# Patient Record
Sex: Female | Born: 1970 | Race: White | Hispanic: No | Marital: Married | Smoking: Never smoker
Health system: Southern US, Community
[De-identification: ages and names within clinical notes are randomized; demographics above are authoritative.]

## PROBLEM LIST (undated history)

## (undated) DIAGNOSIS — I10 Essential (primary) hypertension: Secondary | ICD-10-CM

## (undated) DIAGNOSIS — F329 Major depressive disorder, single episode, unspecified: Secondary | ICD-10-CM

## (undated) DIAGNOSIS — E785 Hyperlipidemia, unspecified: Secondary | ICD-10-CM

## (undated) DIAGNOSIS — F32A Depression, unspecified: Secondary | ICD-10-CM

## (undated) DIAGNOSIS — F419 Anxiety disorder, unspecified: Secondary | ICD-10-CM

## (undated) DIAGNOSIS — T7840XA Allergy, unspecified, initial encounter: Secondary | ICD-10-CM

## (undated) DIAGNOSIS — Z5189 Encounter for other specified aftercare: Secondary | ICD-10-CM

## (undated) HISTORY — DX: Essential (primary) hypertension: I10

## (undated) HISTORY — DX: Encounter for other specified aftercare: Z51.89

## (undated) HISTORY — PX: NO PAST SURGERIES: SHX2092

## (undated) HISTORY — DX: Allergy, unspecified, initial encounter: T78.40XA

## (undated) HISTORY — DX: Hyperlipidemia, unspecified: E78.5

## (undated) HISTORY — DX: Depression, unspecified: F32.A

## (undated) HISTORY — DX: Anxiety disorder, unspecified: F41.9

---
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
## 2012-02-13 DIAGNOSIS — I10 Essential (primary) hypertension: Secondary | ICD-10-CM | POA: Insufficient documentation

## 2012-05-11 DIAGNOSIS — E559 Vitamin D deficiency, unspecified: Secondary | ICD-10-CM | POA: Insufficient documentation

## 2018-02-20 DIAGNOSIS — N920 Excessive and frequent menstruation with regular cycle: Secondary | ICD-10-CM | POA: Insufficient documentation

## 2018-02-20 DIAGNOSIS — E669 Obesity, unspecified: Secondary | ICD-10-CM | POA: Insufficient documentation

## 2019-03-14 ENCOUNTER — Ambulatory Visit (INDEPENDENT_AMBULATORY_CARE_PROVIDER_SITE_OTHER): Payer: 59 | Admitting: Family Medicine

## 2019-03-14 ENCOUNTER — Other Ambulatory Visit: Payer: Self-pay

## 2019-03-14 ENCOUNTER — Encounter: Payer: Self-pay | Admitting: Family Medicine

## 2019-03-14 VITALS — BP 110/78 | HR 68 | Temp 97.8°F | Ht 65.5 in | Wt 245.0 lb

## 2019-03-14 DIAGNOSIS — E785 Hyperlipidemia, unspecified: Secondary | ICD-10-CM

## 2019-03-14 DIAGNOSIS — I10 Essential (primary) hypertension: Secondary | ICD-10-CM

## 2019-03-14 DIAGNOSIS — Z0000 Encounter for general adult medical examination without abnormal findings: Secondary | ICD-10-CM

## 2019-03-14 DIAGNOSIS — F339 Major depressive disorder, recurrent, unspecified: Secondary | ICD-10-CM

## 2019-03-14 MED ORDER — HYDROCHLOROTHIAZIDE 12.5 MG PO TABS: 12.5000 mg | ORAL_TABLET | Freq: Every day | ORAL | 3 refills | Status: DC

## 2019-03-14 MED ORDER — ESCITALOPRAM OXALATE 10 MG PO TABS: 10.0000 mg | ORAL_TABLET | Freq: Every day | ORAL | 3 refills | Status: DC

## 2019-03-14 NOTE — Patient Instructions (Signed)
Preventive Care 40-47 Years Old, Female °Preventive care refers to visits with your health care provider and lifestyle choices that can promote health and wellness. This includes: °· A yearly physical exam. This may also be called an annual well check. °· Regular dental visits and eye exams. °· Immunizations. °· Screening for certain conditions. °· Healthy lifestyle choices, such as eating a healthy diet, getting regular exercise, not using drugs or products that contain nicotine and tobacco, and limiting alcohol use. °What can I expect for my preventive care visit? °Physical exam °Your health care provider will check your: °· Height and weight. This may be used to calculate body mass index (BMI), which tells if you are at a healthy weight. °· Heart rate and blood pressure. °· Skin for abnormal spots. °Counseling °Your health care provider may ask you questions about your: °· Alcohol, tobacco, and drug use. °· Emotional well-being. °· Home and relationship well-being. °· Sexual activity. °· Eating habits. °· Work and work environment. °· Method of birth control. °· Menstrual cycle. °· Pregnancy history. °What immunizations do I need? ° °Influenza (flu) vaccine °· This is recommended every year. °Tetanus, diphtheria, and pertussis (Tdap) vaccine °· You may need a Td booster every 10 years. °Varicella (chickenpox) vaccine °· You may need this if you have not been vaccinated. °Zoster (shingles) vaccine °· You may need this after age 60. °Measles, mumps, and rubella (MMR) vaccine °· You may need at least one dose of MMR if you were born in 1957 or later. You may also need a second dose. °Pneumococcal conjugate (PCV13) vaccine °· You may need this if you have certain conditions and were not previously vaccinated. °Pneumococcal polysaccharide (PPSV23) vaccine °· You may need one or two doses if you smoke cigarettes or if you have certain conditions. °Meningococcal conjugate (MenACWY) vaccine °· You may need this if you  have certain conditions. °Hepatitis A vaccine °· You may need this if you have certain conditions or if you travel or work in places where you may be exposed to hepatitis A. °Hepatitis B vaccine °· You may need this if you have certain conditions or if you travel or work in places where you may be exposed to hepatitis B. °Haemophilus influenzae type b (Hib) vaccine °· You may need this if you have certain conditions. °Human papillomavirus (HPV) vaccine °· If recommended by your health care provider, you may need three doses over 6 months. °You may receive vaccines as individual doses or as more than one vaccine together in one shot (combination vaccines). Talk with your health care provider about the risks and benefits of combination vaccines. °What tests do I need? °Blood tests °· Lipid and cholesterol levels. These may be checked every 5 years, or more frequently if you are over 50 years old. °· Hepatitis C test. °· Hepatitis B test. °Screening °· Lung cancer screening. You may have this screening every year starting at age 55 if you have a 30-pack-year history of smoking and currently smoke or have quit within the past 15 years. °· Colorectal cancer screening. All adults should have this screening starting at age 50 and continuing until age 75. Your health care provider may recommend screening at age 45 if you are at increased risk. You will have tests every 1-10 years, depending on your results and the type of screening test. °· Diabetes screening. This is done by checking your blood sugar (glucose) after you have not eaten for a while (fasting). You may have this   done every 1-3 years.  Mammogram. This may be done every 1-2 years. Talk with your health care provider about when you should start having regular mammograms. This may depend on whether you have a family history of breast cancer.  BRCA-related cancer screening. This may be done if you have a family history of breast, ovarian, tubal, or peritoneal  cancers.  Pelvic exam and Pap test. This may be done every 3 years starting at age 35. Starting at age 17, this may be done every 5 years if you have a Pap test in combination with an HPV test. Other tests  Sexually transmitted disease (STD) testing.  Bone density scan. This is done to screen for osteoporosis. You may have this scan if you are at high risk for osteoporosis. Follow these instructions at home: Eating and drinking  Eat a diet that includes fresh fruits and vegetables, whole grains, lean protein, and low-fat dairy.  Take vitamin and mineral supplements as recommended by your health care provider.  Do not drink alcohol if: ? Your health care provider tells you not to drink. ? You are pregnant, may be pregnant, or are planning to become pregnant.  If you drink alcohol: ? Limit how much you have to 0-1 drink a day. ? Be aware of how much alcohol is in your drink. In the [place] one drink equals one 12 oz bottle of beer (355 mL), one 5 oz glass of wine (148 mL), or one 1 oz glass of hard liquor (44 mL). Lifestyle  Take daily care of your teeth and gums.  Stay active. Exercise for at least 30 minutes on 5 or more days each week.  Do not use any products that contain nicotine or tobacco, such as cigarettes, e-cigarettes, and chewing tobacco. If you need help quitting, ask your health care provider.  If you are sexually active, practice safe sex. Use a condom or other form of birth control (contraception) in order to prevent pregnancy and STIs (sexually transmitted infections).  If told by your health care provider, take low-dose aspirin daily starting at age 42. What's next?  Visit your health care provider once a year for a well check visit.  Ask your health care provider how often you should have your eyes and teeth checked.  Stay up to date on all vaccines. This information is not intended to replace advice given to you by your health care provider. Make sure you  discuss any questions you have with your health care provider. Document Released: 04/24/2015 Document Revised: 12/07/2017 Document Reviewed: 12/07/2017 Elsevier Patient Education  2020 [place] With Depression Everyone experiences occasional disappointment, sadness, and loss in their lives. When you are feeling down, blue, or sad for at least 2 weeks in a row, it may mean that you have depression. Depression can affect your thoughts and feelings, relationships, daily activities, and physical health. It is caused by changes in the way your brain functions. If you receive a diagnosis of depression, your health care provider will tell you which type of depression you have and what treatment options are available to you. If you are living with depression, there are ways to help you recover from it and also ways to prevent it from coming back. How to cope with lifestyle changes Coping with stress     Stress is your bodys reaction to life changes and events, both good and bad. Stressful situations may include:  Getting married.  The death of a spouse.  Losing  a job.  Retiring.  Having a baby. Stress can last just a few hours or it can be ongoing. Stress can play a major role in depression, so it is important to learn both how to cope with stress and how to think about it differently. Talk with your health care provider or a counselor if you would like to learn more about stress reduction. He or she may suggest some stress reduction techniques, such as:  Music therapy. This can include creating music or listening to music. Choose music that you enjoy and that inspires you.  Mindfulness-based meditation. This kind of meditation can be done while sitting or walking. It involves being aware of your normal breaths, rather than trying to control your breathing.  Centering prayer. This is a kind of meditation that involves focusing on a spiritual word or phrase. Choose a word, phrase,  or sacred image that is meaningful to you and that brings you peace.  Deep breathing. To do this, expand your stomach and inhale slowly through your nose. Hold your breath for 3-5 seconds, then exhale slowly, allowing your stomach muscles to relax.  Muscle relaxation. This involves intentionally tensing muscles then relaxing them. Choose a stress reduction technique that fits your lifestyle and personality. Stress reduction techniques take time and practice to develop. Set aside 5-15 minutes a day to do them. Therapists can offer training in these techniques. The training may be covered by some insurance plans. Other things you can do to manage stress include:  Keeping a stress diary. This can help you learn what triggers your stress and ways to control your response.  Understanding what your limits are and saying no to requests or events that lead to a schedule that is too full.  Thinking about how you respond to certain situations. You may not be able to control everything, but you can control how you react.  Adding humor to your life by watching funny films or TV shows.  Making time for activities that help you relax and not feeling guilty about spending your time this way.  Medicines Your health care provider may suggest certain medicines if he or she feels that they will help improve your condition. Avoid using alcohol and other substances that may prevent your medicines from working properly (may interact). It is also important to:  Talk with your pharmacist or health care provider about all the medicines that you take, their possible side effects, and what medicines are safe to take together.  Make it your goal to take part in all treatment decisions (shared decision-making). This includes giving input on the side effects of medicines. It is best if shared decision-making with your health care provider is part of your total treatment plan. If your health care provider prescribes a  medicine, you may not notice the full benefits of it for 4-8 weeks. Most people who are treated for depression need to be on medicine for at least 6-12 months after they feel better. If you are taking medicines as part of your treatment, do not stop taking medicines without first talking to your health care provider. You may need to have the medicine slowly decreased (tapered) over time to decrease the risk of harmful side effects. Relationships Your health care provider may suggest family therapy along with individual therapy and drug therapy. While there may not be family problems that are causing you to feel depressed, it is still important to make sure your family learns as much as they can about  your mental health. Having your familys support can help make your treatment successful. How to recognize changes in your condition Everyone has a different response to treatment for depression. Recovery from major depression happens when you have not had signs of major depression for two months. This may mean that you will start to:  Have more interest in doing activities.  Feel less hopeless than you did 2 months ago.  Have more energy.  Overeat less often, or have better or improving appetite.  Have better concentration. Your health care provider will work with you to decide the next steps in your recovery. It is also important to recognize when your condition is getting worse. Watch for these signs:  Having fatigue or low energy.  Eating too much or too little.  Sleeping too much or too little.  Feeling restless, agitated, or hopeless.  Having trouble concentrating or making decisions.  Having unexplained physical complaints.  Feeling irritable, angry, or aggressive. Get help as soon as you or your family members notice these symptoms coming back. How to get support and help from others How to talk with friends and family members about your condition  Talking to friends and family  members about your condition can provide you with one way to get support and guidance. Reach out to trusted friends or family members, explain your symptoms to them, and let them know that you are working with a health care provider to treat your depression. Financial resources Not all insurance plans cover mental health care, so it is important to check with your insurance carrier. If paying for co-pays or counseling services is a problem, search for a local or county mental health care center. They may be able to offer public mental health care services at low or no cost when you are not able to see a private health care provider. If you are taking medicine for depression, you may be able to get the generic form, which may be less expensive. Some makers of prescription medicines also offer help to patients who cannot afford the medicines they need. Follow these instructions at home:   Get the right amount and quality of sleep.  Cut down on using caffeine, tobacco, alcohol, and other potentially harmful substances.  Try to exercise, such as walking or lifting small weights.  Take over-the-counter and prescription medicines only as told by your health care provider.  Eat a healthy diet that includes plenty of vegetables, fruits, whole grains, low-fat dairy products, and lean protein. Do not eat a lot of foods that are high in solid fats, added sugars, or salt.  Keep all follow-up visits as told by your health care provider. This is important. Contact a health care provider if:  You stop taking your antidepressant medicines, and you have any of these symptoms: ? Nausea. ? Headache. ? Feeling lightheaded. ? Chills and body aches. ? Not being able to sleep (insomnia).  You or your friends and family think your depression is getting worse. Get help right away if:  You have thoughts of hurting yourself or others. If you ever feel like you may hurt yourself or others, or have thoughts about  taking your own life, get help right away. You can go to your nearest emergency department or call:  Your local emergency services (911 in the [place]  A suicide crisis helpline, such as the [place] at (351)574-2551. This is open 24-hours a day. Summary  If you are living with depression, there are  ways to help you recover from it and also ways to prevent it from coming back.  Work with your health care team to create a management plan that includes counseling, stress management techniques, and healthy lifestyle habits. This information is not intended to replace advice given to you by your health care provider. Make sure you discuss any questions you have with your health care provider. Document Released: 02/29/2016 Document Revised: 07/20/2018 Document Reviewed: 02/29/2016 Elsevier Patient Education  2020 [employer].  Managing Your Hypertension Hypertension is commonly called high blood pressure. This is when the force of your blood pressing against the walls of your arteries is too strong. Arteries are blood vessels that carry blood from your heart throughout your body. Hypertension forces the heart to work harder to pump blood, and may cause the arteries to become narrow or stiff. Having untreated or uncontrolled hypertension can cause heart attack, stroke, kidney disease, and other problems. What are blood pressure readings? A blood pressure reading consists of a higher number over a lower number. Ideally, your blood pressure should be below 120/80. The first ("top") number is called the systolic pressure. It is a measure of the pressure in your arteries as your heart beats. The second ("bottom") number is called the diastolic pressure. It is a measure of the pressure in your arteries as the heart relaxes. What does my blood pressure reading mean? Blood pressure is classified into four stages. Based on your blood pressure reading, your health care provider may  use the following stages to determine what type of treatment you need, if any. Systolic pressure and diastolic pressure are measured in a unit called mm Hg. Normal  Systolic pressure: below 253.  Diastolic pressure: below 80. Elevated  Systolic pressure: 664-403.  Diastolic pressure: below 80. Hypertension stage 1  Systolic pressure: 474-259.  Diastolic pressure: 56-38. Hypertension stage 2  Systolic pressure: 756 or above.  Diastolic pressure: 90 or above. What health risks are associated with hypertension? Managing your hypertension is an important responsibility. Uncontrolled hypertension can lead to:  A heart attack.  A stroke.  A weakened blood vessel (aneurysm).  Heart failure.  Kidney damage.  Eye damage.  Metabolic syndrome.  Memory and concentration problems. What changes can I make to manage my hypertension? Hypertension can be managed by making lifestyle changes and possibly by taking medicines. Your health care provider will help you make a plan to bring your blood pressure within a normal range. Eating and drinking   Eat a diet that is high in fiber and potassium, and low in salt (sodium), added sugar, and fat. An example eating plan is called the DASH (Dietary Approaches to Stop Hypertension) diet. To eat this way: ? Eat plenty of fresh fruits and vegetables. Try to fill half of your plate at each meal with fruits and vegetables. ? Eat whole grains, such as whole wheat pasta, brown rice, or whole grain bread. Fill about one quarter of your plate with whole grains. ? Eat low-fat diary products. ? Avoid fatty cuts of meat, processed or cured meats, and poultry with skin. Fill about one quarter of your plate with lean proteins such as fish, chicken without skin, beans, eggs, and tofu. ? Avoid premade and processed foods. These tend to be higher in sodium, added sugar, and fat.  Reduce your daily sodium intake. Most people with hypertension should eat less  than 1,500 mg of sodium a day.  Limit alcohol intake to no more than 1 drink a day  for nonpregnant women and 2 drinks a day for men. One drink equals 12 oz of beer, 5 oz of wine, or 1 oz of hard liquor. Lifestyle  Work with your health care provider to maintain a healthy body weight, or to lose weight. Ask what an ideal weight is for you.  Get at least 30 minutes of exercise that causes your heart to beat faster (aerobic exercise) most days of the week. Activities may include walking, swimming, or biking.  Include exercise to strengthen your muscles (resistance exercise), such as weight lifting, as part of your weekly exercise routine. Try to do these types of exercises for 30 minutes at least 3 days a week.  Do not use any products that contain nicotine or tobacco, such as cigarettes and e-cigarettes. If you need help quitting, ask your health care provider.  Control any long-term (chronic) conditions you have, such as high cholesterol or diabetes. Monitoring  Monitor your blood pressure at home as told by your health care provider. Your personal target blood pressure may vary depending on your medical conditions, your age, and other factors.  Have your blood pressure checked regularly, as often as told by your health care provider. Working with your health care provider  Review all the medicines you take with your health care provider because there may be side effects or interactions.  Talk with your health care provider about your diet, exercise habits, and other lifestyle factors that may be contributing to hypertension.  Visit your health care provider regularly. Your health care provider can help you create and adjust your plan for managing hypertension. Will I need medicine to control my blood pressure? Your health care provider may prescribe medicine if lifestyle changes are not enough to get your blood pressure under control, and if:  Your systolic blood pressure is 130 or  higher.  Your diastolic blood pressure is 80 or higher. Take medicines only as told by your health care provider. Follow the directions carefully. Blood pressure medicines must be taken as prescribed. The medicine does not work as well when you skip doses. Skipping doses also puts you at risk for problems. Contact a health care provider if:  You think you are having a reaction to medicines you have taken.  You have repeated (recurrent) headaches.  You feel dizzy.  You have swelling in your ankles.  You have trouble with your vision. Get help right away if:  You develop a severe headache or confusion.  You have unusual weakness or numbness, or you feel faint.  You have severe pain in your chest or abdomen.  You vomit repeatedly.  You have trouble breathing. Summary  Hypertension is when the force of blood pumping through your arteries is too strong. If this condition is not controlled, it may put you at risk for serious complications.  Your personal target blood pressure may vary depending on your medical conditions, your age, and other factors. For most people, a normal blood pressure is less than 120/80.  Hypertension is managed by lifestyle changes, medicines, or both. Lifestyle changes include weight loss, eating a healthy, low-sodium diet, exercising more, and limiting alcohol. This information is not intended to replace advice given to you by your health care provider. Make sure you discuss any questions you have with your health care provider. Document Released: 12/21/2011 Document Revised: 07/20/2018 Document Reviewed: 02/24/2016 Elsevier Patient Education  2020 [employer].  Exercising to Lose Weight Exercise is structured, repetitive physical activity to improve fitness and health.  Getting regular exercise is important for everyone. It is especially important if you are overweight. Being overweight increases your risk of heart disease, stroke, diabetes, high blood  pressure, and several types of cancer. Reducing your calorie intake and exercising can help you lose weight. Exercise is usually categorized as moderate or vigorous intensity. To lose weight, most people need to do a certain amount of moderate-intensity or vigorous-intensity exercise each week. Moderate-intensity exercise  Moderate-intensity exercise is any activity that gets you moving enough to burn at least three times more energy (calories) than if you were sitting. Examples of moderate exercise include:  Walking a mile in 15 minutes.  Doing light yard work.  Biking at an easy pace. Most people should get at least 150 minutes (2 hours and 30 minutes) a week of moderate-intensity exercise to maintain their body weight. Vigorous-intensity exercise Vigorous-intensity exercise is any activity that gets you moving enough to burn at least six times more calories than if you were sitting. When you exercise at this intensity, you should be working hard enough that you are not able to carry on a conversation. Examples of vigorous exercise include:  Running.  Playing a team sport, such as football, basketball, and soccer.  Jumping rope. Most people should get at least 75 minutes (1 hour and 15 minutes) a week of vigorous-intensity exercise to maintain their body weight. How can exercise affect me? When you exercise enough to burn more calories than you eat, you lose weight. Exercise also reduces body fat and builds muscle. The more muscle you have, the more calories you burn. Exercise also:  Improves mood.  Reduces stress and tension.  Improves your overall fitness, flexibility, and endurance.  Increases bone strength. The amount of exercise you need to lose weight depends on:  Your age.  The type of exercise.  Any health conditions you have.  Your overall physical ability. Talk to your health care provider about how much exercise you need and what types of activities are safe for  you. What actions can I take to lose weight? Nutrition   Make changes to your diet as told by your health care provider or diet and nutrition specialist (dietitian). This may include: ? Eating fewer calories. ? Eating more protein. ? Eating less unhealthy fats. ? Eating a diet that includes fresh fruits and vegetables, whole grains, low-fat dairy products, and lean protein. ? Avoiding foods with added fat, salt, and sugar.  Drink plenty of water while you exercise to prevent dehydration or heat stroke. Activity  Choose an activity that you enjoy and set realistic goals. Your health care provider can help you make an exercise plan that works for you.  Exercise at a moderate or vigorous intensity most days of the week. ? The intensity of exercise may vary from person to person. You can tell how intense a workout is for you by paying attention to your breathing and heartbeat. Most people will notice their breathing and heartbeat get faster with more intense exercise.  Do resistance training twice each week, such as: ? Push-ups. ? Sit-ups. ? Lifting weights. ? Using resistance bands.  Getting short amounts of exercise can be just as helpful as long structured periods of exercise. If you have trouble finding time to exercise, try to include exercise in your daily routine. ? Get up, stretch, and walk around every 30 minutes throughout the day. ? Go for a walk during your lunch break. ? Park your car farther away from your destination. ?  If you take public transportation, get off one stop early and walk the rest of the way. ? Make phone calls while standing up and walking around. ? Take the stairs instead of elevators or escalators.  Wear comfortable clothes and shoes with good support.  Do not exercise so much that you hurt yourself, feel dizzy, or get very short of breath. Where to find more information  U.S. Department of Health and Human Services: BondedCompany.at  Centers for  Disease Control and Prevention (CDC): http://www.wolf.info/ Contact a health care provider:  Before starting a new exercise program.  If you have questions or concerns about your weight.  If you have a medical problem that keeps you from exercising. Get help right away if you have any of the following while exercising:  Injury.  Dizziness.  Difficulty breathing or shortness of breath that does not go away when you stop exercising.  Chest pain.  Rapid heartbeat. Summary  Being overweight increases your risk of heart disease, stroke, diabetes, high blood pressure, and several types of cancer.  Losing weight happens when you burn more calories than you eat.  Reducing the amount of calories you eat in addition to getting regular moderate or vigorous exercise each week helps you lose weight. This information is not intended to replace advice given to you by your health care provider. Make sure you discuss any questions you have with your health care provider. Document Released: 04/30/2010 Document Revised: 04/10/2017 Document Reviewed: 04/10/2017 Elsevier Patient Education  2020 [place].  Preventing Unhealthy [employer], Adult Staying at a healthy weight is important to your overall health. When fat builds up in your body, you may become overweight or obese. Being overweight or obese increases your risk of developing certain health problems, such as heart disease, diabetes, sleeping problems, joint problems, and some types of cancer. Unhealthy weight gain is often the result of making unhealthy food choices or not getting enough exercise. You can make changes to your lifestyle to prevent obesity and stay as healthy as possible. What nutrition changes can be made?   Eat only as much as your body needs. To do this: ? Pay attention to signs that you are hungry or full. Stop eating as soon as you feel full. ? If you feel hungry, try drinking water first before eating. Drink enough water so  your urine is clear or pale yellow. ? Eat smaller portions. Pay attention to portion sizes when eating out. ? Look at serving sizes on food labels. Most foods contain more than one serving per container. ? Eat the recommended number of calories for your gender and activity level. For most active people, a daily total of 2,000 calories is appropriate. If you are trying to lose weight or are not very active, you may need to eat fewer calories. Talk with your health care provider or a diet and nutrition specialist (dietitian) about how many calories you need each day.  Choose healthy foods, such as: ? Fruits and vegetables. At each meal, try to fill at least half of your plate with fruits and vegetables. ? Whole grains, such as whole-wheat bread, brown rice, and quinoa. ? Lean meats, such as chicken or fish. ? Other healthy proteins, such as beans, eggs, or tofu. ? Healthy fats, such as nuts, seeds, fatty fish, and olive oil. ? Low-fat or fat-free dairy products.  Check food labels, and avoid food and drinks that: ? Are high in calories. ? Have added sugar. ? Are high in  sodium. ? Have saturated fats or trans fats.  Cook foods in healthier ways, such as by baking, broiling, or grilling.  Make a meal plan for the week, and shop with a grocery list to help you stay on track with your purchases. Try to avoid going to the grocery store when you are hungry.  When grocery shopping, try to shop around the outside of the store first, where the fresh foods are. Doing this helps you to avoid prepackaged foods, which can be high in sugar, salt (sodium), and fat. What lifestyle changes can be made?   Exercise for 30 or more minutes on 5 or more days each week. Exercising may include brisk walking, yard work, biking, running, swimming, and team sports like basketball and soccer. Ask your health care provider which exercises are safe for you.  Do muscle-strengthening activities, such as lifting weights  or using resistance bands, on 2 or more days a week.  Do not use any products that contain nicotine or tobacco, such as cigarettes and e-cigarettes. If you need help quitting, ask your health care provider.  Limit alcohol intake to no more than 1 drink a day for nonpregnant women and 2 drinks a day for men. One drink equals 12 oz of beer, 5 oz of wine, or 1 oz of hard liquor.  Try to get 7-9 hours of sleep each night. What other changes can be made?  Keep a food and activity journal to keep track of: ? What you ate and how many calories you had. Remember to count the calories in sauces, dressings, and side dishes. ? Whether you were active, and what exercises you did. ? Your calorie, weight, and activity goals.  Check your weight regularly. Track any changes. If you notice you have gained weight, make changes to your diet or activity routine.  Avoid taking weight-loss medicines or supplements. Talk to your health care provider before starting any new medicine or supplement.  Talk to your health care provider before trying any new diet or exercise plan. Why are these changes important? Eating healthy, staying active, and having healthy habits can help you to prevent obesity. Those changes also:  Help you manage stress and emotions.  Help you connect with friends and family.  Improve your self-esteem.  Improve your sleep.  Prevent long-term health problems. What can happen if changes are not made? Being obese or overweight can cause you to develop joint or bone problems, which can make it hard for you to stay active or do activities you enjoy. Being obese or overweight also puts stress on your heart and lungs and can lead to health problems like diabetes, heart disease, and some cancers. Where to find more information Talk with your health care provider or a dietitian about healthy eating and healthy lifestyle choices. You may also find information from:  [place]. Department of  Agriculture, My[employer]: FormerBoss.no  American Heart Association: www.heart.org  Centers for Disease Control and Prevention: http://www.wolf.info/ Summary  Staying at a healthy weight is important to your overall health. It helps you to prevent certain diseases and health problems, such as heart disease, diabetes, joint problems, sleep disorders, and some types of cancer.  Being obese or overweight can cause you to develop joint or bone problems, which can make it hard for you to stay active or do activities you enjoy.  You can prevent unhealthy weight gain by eating a healthy diet, exercising regularly, not smoking, limiting alcohol, and getting enough sleep.  Talk with  your health care provider or a dietitian for guidance about healthy eating and healthy lifestyle choices. This information is not intended to replace advice given to you by your health care provider. Make sure you discuss any questions you have with your health care provider. Document Released: 03/29/2016 Document Revised: 03/31/2017 Document Reviewed: 05/04/2016 Elsevier Patient Education  2020 [employer].

## 2019-03-14 NOTE — Progress Notes (Signed)
Patient presents to clinic today to f/u on chronic conditions, CPE, and establish care.  SUBJECTIVE: PMH: Pt is a 48 yo female with pmh sig for h/o depression, HTN, HLD.    HTN:  -taking HCTZ 12.5 mg daily -needs refill -planning to exercise  HLD: -not currently on meds  H/o Depression: -taking Escitalopram 10 mg daily  -requesting refill -states mood, sleep, and energy are good on medication. -denies SI/HI  Allergies: NKDA -Notes her Dad and sister are allergic to IV dye.  Social hx: Pt and her partner are married.  Pt works in Insurance claims handler.  Pt endorses social EtOH use.  Pt denies tobacco and drug use.  Health Maintenance: Dental --Dr. Christie [facility] OB/Gyn:  Dr. Si Raider Immunizations -- Tetanus 2012?, influenza 2020 LMP--03/03/19 Mammogram -- July 2020 PAP --  July 2020  Family hx: Mom- alive, HTN, HLD Dad- desc, Pancreatic cancer, Depression, MI, HLD, HTN Sister- Susu, alive, Asthma, depression MGM-desc, HTN, HLd MGF, desc, MI, heart dz, HLd, HTN PGM-breast cancer PGF-desc, mesothelioma with mets to the brain.  Past Medical History:  Diagnosis Date  . Depression   . Hyperlipidemia   . Hypertension     History reviewed. No pertinent surgical history.  No current outpatient medications on file prior to visit.   No current facility-administered medications on file prior to visit.     No Known Allergies  Family History  Problem Relation Age of Onset  . Hypertension Mother   . Hyperlipidemia Mother   . Hyperlipidemia Father   . Hypertension Father   . Heart attack Father   . Depression Father   . Cancer Father   . Asthma Sister   . Depression Sister   . Hypertension Maternal Grandmother   . Hyperlipidemia Maternal Grandmother   . Hypertension Maternal Grandfather   . Hyperlipidemia Maternal Grandfather   . Heart disease Maternal Grandfather   . Heart attack Maternal Grandfather     Social History   Socioeconomic History  . Marital  status: Unknown    Spouse name: Not on file  . Number of children: Not on file  . Years of education: Not on file  . Highest education level: Not on file  Occupational History  . Not on file  Social Needs  . Financial resource strain: Not on file  . Food insecurity    Worry: Not on file    Inability: Not on file  . Transportation needs    Medical: Not on file    Non-medical: Not on file  Tobacco Use  . Smoking status: Never Smoker  . Smokeless tobacco: Never Used  Substance and Sexual Activity  . Alcohol use: Yes  . Drug use: Never  . Sexual activity: Yes  Lifestyle  . Physical activity    Days per week: Not on file    Minutes per session: Not on file  . Stress: Not on file  Relationships  . Social Herbalist on phone: Not on file    Gets together: Not on file    Attends religious service: Not on file    Active member of club or organization: Not on file    Attends meetings of clubs or organizations: Not on file    Relationship status: Not on file  . Intimate partner violence    Fear of current or ex partner: Not on file    Emotionally abused: Not on file    Physically abused: Not on file    Forced  sexual activity: Not on file  Other Topics Concern  . Not on file  Social History Narrative  . Not on file    ROS General: Denies fever, chills, night sweats, changes in weight, changes in appetite HEENT: Denies headaches, ear pain, changes in vision, rhinorrhea, sore throat CV: Denies CP, palpitations, SOB, orthopnea Pulm: Denies SOB, cough, wheezing GI: Denies abdominal pain, nausea, vomiting, diarrhea, constipation GU: Denies dysuria, hematuria, frequency, vaginal discharge Msk: Denies muscle cramps, joint pains Neuro: Denies weakness, numbness, tingling Skin: Denies rashes, bruising Psych: Denies anxiety, hallucinations  +h/o depression  BP 110/78 (BP Location: Left Arm, Patient Position: Sitting, Cuff Size: Large)   Pulse 68   Temp 97.8 F (36.6  C) (Temporal)   Ht 5' 5.5" (1.664 m)   Wt 245 lb (111.1 kg)   LMP 03/03/2019 (Exact Date)   SpO2 98%   BMI 40.15 kg/m   Physical Exam Gen. Pleasant, well developed, well-nourished, in NAD HEENT - [place]/AT, PERRL, EOMI, conjunctive clear, no scleral icterus, no nasal drainage, pharynx without erythema or exudate. Neck: No JVD, no thyromegaly, no carotid bruits Lungs: no use of accessory muscles, CTAB, no wheezes, rales or rhonchi Cardiovascular: RRR, No r/g/m, no peripheral edema Abdomen: BS present, soft, nontender,nondistended, no hepatosplenomegaly Musculoskeletal: No deformities, moves all four extremities, no cyanosis or clubbing, normal tone Neuro:  A&Ox3, CN II-XII intact, normal gait Skin:  Warm, dry, intact, no lesions Psych: normal affect, mood appropriate   No results found for this or any previous visit (from the past 2160 hour(s)).  Assessment/Plan: Well adult exam  -Anticipatory guidance given including wearing seatbelts, smoke detectors in the home, increasing physical activity, increasing p.o. intake of water and vegetables. -will obtain labs -Pap and mammogram up to date done July 2020 -immunizations up to date -given handout -next CPE in 1 yr. - Plan: CBC (no diff)  Essential hypertension  -controlled -discussed lifestyle modifications including increasing physical activity - Plan: Basic Metabolic Panel, hydrochlorothiazide (HYDRODIURIL) 12.5 MG tablet  Depression, recurrent (HCC)  -stable -continue lexapro 10 mg daily -consider counseling. - Plan: TSH, T4, Free, escitalopram (LEXAPRO) 10 MG tablet  Hyperlipidemia, unspecified hyperlipidemia type  - Plan: Lipid panel  F/u prn in 3 months  Grier Mitts, MD

## 2019-03-15 ENCOUNTER — Other Ambulatory Visit (INDEPENDENT_AMBULATORY_CARE_PROVIDER_SITE_OTHER): Payer: 59

## 2019-03-15 DIAGNOSIS — F339 Major depressive disorder, recurrent, unspecified: Secondary | ICD-10-CM | POA: Diagnosis not present

## 2019-03-15 DIAGNOSIS — I10 Essential (primary) hypertension: Secondary | ICD-10-CM

## 2019-03-15 DIAGNOSIS — Z13220 Encounter for screening for lipoid disorders: Secondary | ICD-10-CM

## 2019-03-15 DIAGNOSIS — Z0000 Encounter for general adult medical examination without abnormal findings: Secondary | ICD-10-CM

## 2019-03-15 LAB — CBC
HCT: 41.8 % (ref 36.0–46.0)
Hemoglobin: 14.1 g/dL (ref 12.0–15.0)
MCHC: 33.8 g/dL (ref 30.0–36.0)
MCV: 89.5 fl (ref 78.0–100.0)
Platelets: 278 10*3/uL (ref 150.0–400.0)
RBC: 4.67 Mil/uL (ref 3.87–5.11)
RDW: 12.7 % (ref 11.5–15.5)
WBC: 6.2 10*3/uL (ref 4.0–10.5)

## 2019-03-15 LAB — LIPID PANEL
Cholesterol: 268 mg/dL — ABNORMAL HIGH (ref 0–200)
HDL: 54.2 mg/dL (ref >39.00)
NonHDL: 213.45
Total CHOL/HDL Ratio: 5
Triglycerides: 398 mg/dL — ABNORMAL HIGH (ref 0.0–149.0)
VLDL: 79.6 mg/dL — ABNORMAL HIGH (ref 0.0–40.0)

## 2019-03-15 LAB — BASIC METABOLIC PANEL
BUN: 15 mg/dL (ref 6–23)
CO2: 26 mEq/L (ref 19–32)
Calcium: 9.2 mg/dL (ref 8.4–10.5)
Chloride: 102 mEq/L (ref 96–112)
Creatinine, Ser: 0.73 mg/dL (ref 0.40–1.20)
GFR: 85.39 mL/min (ref >60.00)
Glucose, Bld: 101 mg/dL — ABNORMAL HIGH (ref 70–99)
Potassium: 4.1 mEq/L (ref 3.5–5.1)
Sodium: 139 mEq/L (ref 135–145)

## 2019-03-15 LAB — T4, FREE: Free T4: 0.7 ng/dL (ref 0.60–1.60)

## 2019-03-15 LAB — LDL CHOLESTEROL, DIRECT: Direct LDL: 140 mg/dL

## 2019-03-15 LAB — TSH: TSH: 2.88 u[IU]/mL (ref 0.35–4.50)

## 2019-03-17 ENCOUNTER — Encounter: Payer: Self-pay | Admitting: Family Medicine

## 2019-03-17 DIAGNOSIS — E785 Hyperlipidemia, unspecified: Secondary | ICD-10-CM | POA: Insufficient documentation

## 2019-03-17 DIAGNOSIS — F32A Depression, unspecified: Secondary | ICD-10-CM | POA: Insufficient documentation

## 2019-03-17 DIAGNOSIS — I10 Essential (primary) hypertension: Secondary | ICD-10-CM | POA: Insufficient documentation

## 2019-03-17 DIAGNOSIS — F339 Major depressive disorder, recurrent, unspecified: Secondary | ICD-10-CM | POA: Insufficient documentation

## 2020-03-06 ENCOUNTER — Other Ambulatory Visit: Payer: Self-pay | Admitting: Family Medicine

## 2020-03-06 DIAGNOSIS — I10 Essential (primary) hypertension: Secondary | ICD-10-CM

## 2020-03-06 DIAGNOSIS — F339 Major depressive disorder, recurrent, unspecified: Secondary | ICD-10-CM

## 2020-03-16 ENCOUNTER — Other Ambulatory Visit: Payer: Self-pay

## 2020-03-16 ENCOUNTER — Ambulatory Visit (INDEPENDENT_AMBULATORY_CARE_PROVIDER_SITE_OTHER): Payer: BC Managed Care – PPO | Admitting: Family Medicine

## 2020-03-16 ENCOUNTER — Encounter: Payer: Self-pay | Admitting: Family Medicine

## 2020-03-16 VITALS — BP 124/82 | HR 76 | Temp 98.6°F | Ht 66.0 in | Wt 249.0 lb

## 2020-03-16 DIAGNOSIS — J018 Other acute sinusitis: Secondary | ICD-10-CM | POA: Diagnosis not present

## 2020-03-16 DIAGNOSIS — Z23 Encounter for immunization: Secondary | ICD-10-CM

## 2020-03-16 DIAGNOSIS — Z0001 Encounter for general adult medical examination with abnormal findings: Secondary | ICD-10-CM

## 2020-03-16 DIAGNOSIS — Z6841 Body Mass Index (BMI) 40.0 and over, adult: Secondary | ICD-10-CM

## 2020-03-16 DIAGNOSIS — E782 Mixed hyperlipidemia: Secondary | ICD-10-CM | POA: Diagnosis not present

## 2020-03-16 DIAGNOSIS — Z0000 Encounter for general adult medical examination without abnormal findings: Secondary | ICD-10-CM

## 2020-03-16 DIAGNOSIS — I10 Essential (primary) hypertension: Secondary | ICD-10-CM | POA: Diagnosis not present

## 2020-03-16 DIAGNOSIS — F339 Major depressive disorder, recurrent, unspecified: Secondary | ICD-10-CM

## 2020-03-16 MED ORDER — AMOXICILLIN-POT CLAVULANATE 875-125 MG PO TABS: 1.0000 | ORAL_TABLET | Freq: Two times a day (BID) | ORAL | 0 refills | Status: AC

## 2020-03-16 MED ORDER — ESCITALOPRAM OXALATE 10 MG PO TABS: 10.0000 mg | ORAL_TABLET | Freq: Every day | ORAL | 3 refills | Status: DC

## 2020-03-16 MED ORDER — HYDROCHLOROTHIAZIDE 12.5 MG PO TABS: 12.5000 mg | ORAL_TABLET | Freq: Every day | ORAL | 3 refills | Status: DC

## 2020-03-16 NOTE — Patient Instructions (Signed)
Preventive Care 40-48 Years Old, Female Preventive care refers to visits with your health care provider and lifestyle choices that can promote health and wellness. This includes:  A yearly physical exam. This may also be called an annual well check.  Regular dental visits and eye exams.  Immunizations.  Screening for certain conditions.  Healthy lifestyle choices, such as eating a healthy diet, getting regular exercise, not using drugs or products that contain nicotine and tobacco, and limiting alcohol use. What can I expect for my preventive care visit? Physical exam Your health care provider will check your:  Height and weight. This may be used to calculate body mass index (BMI), which tells if you are at a healthy weight.  Heart rate and blood pressure.  Skin for abnormal spots. Counseling Your health care provider may ask you questions about your:  Alcohol, tobacco, and drug use.  Emotional well-being.  Home and relationship well-being.  Sexual activity.  Eating habits.  Work and work environment.  Method of birth control.  Menstrual cycle.  Pregnancy history. What immunizations do I need?  Influenza (flu) vaccine  This is recommended every year. Tetanus, diphtheria, and pertussis (Tdap) vaccine  You may need a Td booster every 10 years. Varicella (chickenpox) vaccine  You may need this if you have not been vaccinated. Zoster (shingles) vaccine  You may need this after age 60. Measles, mumps, and rubella (MMR) vaccine  You may need at least one dose of MMR if you were born in 1957 or later. You may also need a second dose. Pneumococcal conjugate (PCV13) vaccine  You may need this if you have certain conditions and were not previously vaccinated. Pneumococcal polysaccharide (PPSV23) vaccine  You may need one or two doses if you smoke cigarettes or if you have certain conditions. Meningococcal conjugate (MenACWY) vaccine  You may need this if you  have certain conditions. Hepatitis A vaccine  You may need this if you have certain conditions or if you travel or work in places where you may be exposed to hepatitis A. Hepatitis B vaccine  You may need this if you have certain conditions or if you travel or work in places where you may be exposed to hepatitis B. Haemophilus influenzae type b (Hib) vaccine  You may need this if you have certain conditions. Human papillomavirus (HPV) vaccine  If recommended by your health care provider, you may need three doses over 6 months. You may receive vaccines as individual doses or as more than one vaccine together in one shot (combination vaccines). Talk with your health care provider about the risks and benefits of combination vaccines. What tests do I need? Blood tests  Lipid and cholesterol levels. These may be checked every 5 years, or more frequently if you are over 48 years old.  Hepatitis C test.  Hepatitis B test. Screening  Lung cancer screening. You may have this screening every year starting at age 48 if you have a 30-pack-year history of smoking and currently smoke or have quit within the past 15 years.  Colorectal cancer screening. All adults should have this screening starting at age 48 and continuing until age 48. Your health care provider may recommend screening at age 48 if you are at increased risk. You will have tests every 1-10 years, depending on your results and the type of screening test.  Diabetes screening. This is done by checking your blood sugar (glucose) after you have not eaten for a while (fasting). You may have this   done every 1-3 years.  Mammogram. This may be done every 1-2 years. Talk with your health care provider about when you should start having regular mammograms. This may depend on whether you have a family history of breast cancer.  BRCA-related cancer screening. This may be done if you have a family history of breast, ovarian, tubal, or peritoneal  cancers.  Pelvic exam and Pap test. This may be done every 3 years starting at age 48. Starting at age 48, this may be done every 5 years if you have a Pap test in combination with an HPV test. Other tests  Sexually transmitted disease (STD) testing.  Bone density scan. This is done to screen for osteoporosis. You may have this scan if you are at high risk for osteoporosis. Follow these instructions at home: Eating and drinking  Eat a diet that includes fresh fruits and vegetables, whole grains, lean protein, and low-fat dairy.  Take vitamin and mineral supplements as recommended by your health care provider.  Do not drink alcohol if: ? Your health care provider tells you not to drink. ? You are pregnant, may be pregnant, or are planning to become pregnant.  If you drink alcohol: ? Limit how much you have to 0-1 drink a day. ? Be aware of how much alcohol is in your drink. In the [place] one drink equals one 12 oz bottle of beer (355 mL), one 5 oz glass of wine (148 mL), or one 1 oz glass of hard liquor (44 mL). Lifestyle  Take daily care of your teeth and gums.  Stay active. Exercise for at least 30 minutes on 5 or more days each week.  Do not use any products that contain nicotine or tobacco, such as cigarettes, e-cigarettes, and chewing tobacco. If you need help quitting, ask your health care provider.  If you are sexually active, practice safe sex. Use a condom or other form of birth control (contraception) in order to prevent pregnancy and STIs (sexually transmitted infections).  If told by your health care provider, take low-dose aspirin daily starting at age 48. What's next?  Visit your health care provider once a year for a well check visit.  Ask your health care provider how often you should have your eyes and teeth checked.  Stay up to date on all vaccines. This information is not intended to replace advice given to you by your health care provider. Make sure you  discuss any questions you have with your health care provider. Document Revised: 12/07/2017 Document Reviewed: 12/07/2017 Elsevier Patient Education  2020 [employer].  High Cholesterol  High cholesterol is a condition in which the blood has high levels of a white, waxy, fat-like substance (cholesterol). The human body needs small amounts of cholesterol. The liver makes all the cholesterol that the body needs. Extra (excess) cholesterol comes from the food that we eat. Cholesterol is carried from the liver by the blood through the blood vessels. If you have high cholesterol, deposits (plaques) may build up on the walls of your blood vessels (arteries). Plaques make the arteries narrower and stiffer. Cholesterol plaques increase your risk for heart attack and stroke. Work with your health care provider to keep your cholesterol levels in a healthy range. What increases the risk? This condition is more likely to develop in people who:  Eat foods that are high in animal fat (saturated fat) or cholesterol.  Are overweight.  Are not getting enough exercise.  Have a family history of high cholesterol.  What are the signs or symptoms? There are no symptoms of this condition. How is this diagnosed? This condition may be diagnosed from the results of a blood test.  If you are older than age 77, your health care provider may check your cholesterol every 4-6 years.  You may be checked more often if you already have high cholesterol or other risk factors for heart disease. The blood test for cholesterol measures:  "Bad" cholesterol (LDL cholesterol). This is the main type of cholesterol that causes heart disease. The desired level for LDL is less than 100.  "Good" cholesterol (HDL cholesterol). This type helps to protect against heart disease by cleaning the arteries and carrying the LDL away. The desired level for HDL is 60 or higher.  Triglycerides. These are fats that the body can store or  burn for energy. The desired number for triglycerides is lower than 150.  Total cholesterol. This is a measure of the total amount of cholesterol in your blood, including LDL cholesterol, HDL cholesterol, and triglycerides. A healthy number is less than 200. How is this treated? This condition is treated with diet changes, lifestyle changes, and medicines. Diet changes  This may include eating more whole grains, fruits, vegetables, nuts, and fish.  This may also include cutting back on red meat and foods that have a lot of added sugar. Lifestyle changes  Changes may include getting at least 40 minutes of aerobic exercise 3 times a week. Aerobic exercises include walking, biking, and swimming. Aerobic exercise along with a healthy diet can help you maintain a healthy weight.  Changes may also include quitting smoking. Medicines  Medicines are usually given if diet and lifestyle changes have failed to reduce your cholesterol to healthy levels.  Your health care provider may prescribe a statin medicine. Statin medicines have been shown to reduce cholesterol, which can reduce the risk of heart disease. Follow these instructions at home: Eating and drinking If told by your health care provider:  Eat chicken (without skin), fish, veal, shellfish, ground [place] breast, and round or loin cuts of red meat.  Do not eat fried foods or fatty meats, such as hot dogs and salami.  Eat plenty of fruits, such as apples.  Eat plenty of vegetables, such as broccoli, potatoes, and carrots.  Eat beans, peas, and lentils.  Eat grains such as barley, rice, couscous, and bulgur wheat.  Eat pasta without cream sauces.  Use skim or nonfat milk, and eat low-fat or nonfat yogurt and cheeses.  Do not eat or drink whole milk, cream, ice cream, egg yolks, or hard cheeses.  Do not eat stick margarine or tub margarines that contain trans fats (also called partially hydrogenated oils).  Do not eat  saturated tropical oils, such as coconut oil and palm oil.  Do not eat cakes, cookies, crackers, or other baked goods that contain trans fats.  General instructions  Exercise as directed by your health care provider. Increase your activity level with activities such as gardening, walking, and taking the stairs.  Take over-the-counter and prescription medicines only as told by your health care provider.  Do not use any products that contain nicotine or tobacco, such as cigarettes and e-cigarettes. If you need help quitting, ask your health care provider.  Keep all follow-up visits as told by your health care provider. This is important. Contact a health care provider if:  You are struggling to maintain a healthy diet or weight.  You need help to start on an  exercise program.  You need help to stop smoking. Get help right away if:  You have chest pain.  You have trouble breathing. This information is not intended to replace advice given to you by your health care provider. Make sure you discuss any questions you have with your health care provider. Document Revised: 03/31/2017 Document Reviewed: 09/26/2015 Elsevier Patient Education  [place].  Sinusitis, Adult Sinusitis is soreness and swelling (inflammation) of your sinuses. Sinuses are hollow spaces in the bones around your face. They are located:  Around your eyes.  In the middle of your forehead.  Behind your nose.  In your cheekbones. Your sinuses and nasal passages are lined with a fluid called mucus. Mucus drains out of your sinuses. Swelling can trap mucus in your sinuses. This lets germs (bacteria, virus, or fungus) grow, which leads to infection. Most of the time, this condition is caused by a virus. What are the causes? This condition is caused by:  Allergies.  Asthma.  Germs.  Things that block your nose or sinuses.  Growths in the nose (nasal polyps).  Chemicals or irritants in the  air.  Fungus (rare). What increases the risk? You are more likely to develop this condition if:  You have a weak body defense system (immune system).  You do a lot of swimming or diving.  You use nasal sprays too much.  You smoke. What are the signs or symptoms? The main symptoms of this condition are pain and a feeling of pressure around the sinuses. Other symptoms include:  Stuffy nose (congestion).  Runny nose (drainage).  Swelling and warmth in the sinuses.  Headache.  Toothache.  A cough that may get worse at night.  Mucus that collects in the throat or the back of the nose (postnasal drip).  Being unable to smell and taste.  Being very tired (fatigue).  A fever.  Sore throat.  Bad breath. How is this diagnosed? This condition is diagnosed based on:  Your symptoms.  Your medical history.  A physical exam.  Tests to find out if your condition is short-term (acute) or long-term (chronic). Your doctor may: ? Check your nose for growths (polyps). ? Check your sinuses using a tool that has a light (endoscope). ? Check for allergies or germs. ? Do imaging tests, such as an MRI or CT scan. How is this treated? Treatment for this condition depends on the cause and whether it is short-term or long-term.  If caused by a virus, your symptoms should go away on their own within 10 days. You may be given medicines to relieve symptoms. They include: ? Medicines that shrink swollen tissue in the nose. ? Medicines that treat allergies (antihistamines). ? A spray that treats swelling of the nostrils. ? Rinses that help get rid of thick mucus in your nose (nasal saline washes).  If caused by bacteria, your doctor may wait to see if you will get better without treatment. You may be given antibiotic medicine if you have: ? A very bad infection. ? A weak body defense system.  If caused by growths in the nose, you may need to have surgery. Follow these instructions  at home: Medicines  Take, use, or apply over-the-counter and prescription medicines only as told by your doctor. These may include nasal sprays.  If you were prescribed an antibiotic medicine, take it as told by your doctor. Do not stop taking the antibiotic even if you start to feel better. Hydrate and humidify  Drink enough water to keep your pee (urine) pale yellow.  Use a cool mist humidifier to keep the humidity level in your home above 50%.  Breathe in steam for 10-15 minutes, 3-4 times a day, or as told by your doctor. You can do this in the bathroom while a hot shower is running.  Try not to spend time in cool or dry air. Rest  Rest as much as you can.  Sleep with your head raised (elevated).  Make sure you get enough sleep each night. General instructions   Put a warm, moist washcloth on your face 3-4 times a day, or as often as told by your doctor. This will help with discomfort.  Wash your hands often with soap and water. If there is no soap and water, use hand sanitizer.  Do not smoke. Avoid being around people who are smoking (secondhand smoke).  Keep all follow-up visits as told by your doctor. This is important. Contact a doctor if:  You have a fever.  Your symptoms get worse.  Your symptoms do not get better within 10 days. Get help right away if:  You have a very bad headache.  You cannot stop throwing up (vomiting).  You have very bad pain or swelling around your face or eyes.  You have trouble seeing.  You feel confused.  Your neck is stiff.  You have trouble breathing. Summary  Sinusitis is swelling of your sinuses. Sinuses are hollow spaces in the bones around your face.  This condition is caused by tissues in your nose that become inflamed or swollen. This traps germs. These can lead to infection.  If you were prescribed an antibiotic medicine, take it as told by your doctor. Do not stop taking it even if you start to feel  better.  Keep all follow-up visits as told by your doctor. This is important. This information is not intended to replace advice given to you by your health care provider. Make sure you discuss any questions you have with your health care provider. Document Revised: 08/28/2017 Document Reviewed: 08/28/2017 Elsevier Patient Education  [place].

## 2020-03-16 NOTE — Progress Notes (Signed)
Subjective:     Dorothy Taylor is a 49 y.o. female with pmh sig for HTN, HLD, h/o depression who  is here for a comprehensive physical exam. The patient reports problems - sinus issues x 2 wks. taking NyQuil and DayQuil which helped but symptoms seem to return.  Patient notes congestion chest at times with cough, right-sided headaches, right jaw pain, and an occasional wheeze at night.  Symptoms started around Thanksgiving.  Patient also using saline nasal rinse daily.  Has Flonase but has not been using.  Patient notes increased allergies this fall.  Otherwise patient doing well.  Had Pap and mammogram July 2021.  On birth control, LMP 3/21.  Patient endorses having both Pfizzer COVID vaccines on 06/18/2019, 07/19/2019, and booster 02/21/2020.  Patient inquires about influenza vaccine.  Social History   Socioeconomic History  . Marital status: Married    Spouse name: Not on file  . Number of children: Not on file  . Years of education: Not on file  . Highest education level: Not on file  Occupational History  . Not on file  Tobacco Use  . Smoking status: Never Smoker  . Smokeless tobacco: Never Used  Substance and Sexual Activity  . Alcohol use: Yes  . Drug use: Never  . Sexual activity: Yes  Other Topics Concern  . Not on file  Social History Narrative  . Not on file   Social Determinants of Health   Financial Resource Strain:   . Difficulty of Paying Living Expenses: Not on file  Food Insecurity:   . Worried About Charity fundraiser in the Last Year: Not on file  . Ran Out of Food in the Last Year: Not on file  Transportation Needs:   . Lack of Transportation (Medical): Not on file  . Lack of Transportation (Non-Medical): Not on file  Physical Activity:   . Days of Exercise per Week: Not on file  . Minutes of Exercise per Session: Not on file  Stress:   . Feeling of Stress : Not on file  Social Connections:   . Frequency of Communication with Friends and Family:  Not on file  . Frequency of Social Gatherings with Friends and Family: Not on file  . Attends Religious Services: Not on file  . Active Member of Clubs or Organizations: Not on file  . Attends Archivist Meetings: Not on file  . Marital Status: Not on file  Intimate Partner Violence:   . Fear of Current or Ex-Partner: Not on file  . Emotionally Abused: Not on file  . Physically Abused: Not on file  . Sexually Abused: Not on file   Health Maintenance  Topic Date Due  . Hepatitis C Screening  Never done  . HIV Screening  Never done  . PAP SMEAR-Modifier  Never done  . INFLUENZA VACCINE  11/10/2019  . COVID-19 Vaccine (2 - Pfizer 2-dose series) 03/13/2020  . TETANUS/TDAP  09/23/2021    The following portions of the patient's history were reviewed and updated as appropriate: allergies, current medications, past family history, past medical history, past social history, past surgical history and problem list.  Review of Systems Pertinent items noted in HPI and remainder of comprehensive ROS otherwise negative.   Objective:    BP 124/82 (BP Location: Left Arm, Patient Position: Sitting, Cuff Size: Large)   Pulse 76   Temp 98.6 F (37 C) (Oral)   Ht $R'5\' 6"'YR$  (1.676 m)   Wt 249 lb (112.9  kg)   LMP 07/10/2019 (Exact Date)   SpO2 98%   BMI 40.19 kg/m  General appearance: alert, cooperative and no distress Head: Normocephalic, without obvious abnormality, atraumatic Eyes: conjunctivae/corneas clear. PERRL, EOM's intact. Fundi benign. Ears: Right TM full without erythema or supratip fluid.  Left TM and external ear canals both ears normal. Nose: Nares normal. Septum midline. Mucosa normal. No drainage or sinus tenderness. Throat: lips, mucosa, and tongue normal; teeth and gums normal Neck: no carotid bruit, no JVD, supple, symmetrical, trachea midline, thyroid not enlarged, symmetric, no tenderness/mass/nodules and cervical lymphadenopathy. Lungs: clear to auscultation  bilaterally Heart: regular rate and rhythm, S1, S2 normal, no murmur, click, rub or gallop Abdomen: soft, non-tender; bowel sounds normal; no masses,  no organomegaly Extremities: extremities normal, atraumatic, no cyanosis or edema Pulses: 2+ and symmetric Skin: Skin color, texture, turgor normal. No rashes or lesions Lymph nodes: Cervical, supraclavicular, and axillary nodes normal. Neurologic: Alert and oriented X 3, normal strength and tone. Normal symmetric reflexes. Normal coordination and gait    Assessment:    Healthy female exam with abnormal findings including sinusitis.     Plan:     Anticipatory guidance given including wearing seatbelts, smoke detectors in the home, increasing physical activity, increasing p.o. intake of water and vegetables. -will obtain labs -Pap up-to-date, done July 2021 -Mammogram up-to-date done July 2021 -Patient completed [place] vaccine.  To be updated in discharge. -Given handout -Next CPE in 1 year See After Visit Summary for Counseling Recommendations    Other subacute sinusitis -Discussed allergy control -Continue nasal saline rinse.  Consider using Flonase nasal spray -Augmentin sent to pharmacy.  NKDA -Follow-up as needed for continued or worsening symptoms -Given handout - Plan: amoxicillin-clavulanate (AUGMENTIN) 875-125 MG tablet  Class 3 severe obesity due to excess calories with serious comorbidity and body mass index (BMI) of 40.0 to 44.9 in adult [facility] [place]) -Increasing physical activity - Plan: Hemoglobin A1c, TSH, T4, free  Mixed hyperlipidemia - Plan: CMP with eGFR([facility]), Lipid panel  Depression, recurrent (HCC) -PHQ-9 score 1 -GAD-7 score 0 -Continue Lexapro 10 mg daily -Consider wean of medication given control of symptoms  Need for influenza vaccination  - Plan: Flu Vaccine QUAD 6+ mos PF IM (Fluarix Quad PF)  Essential hypertension -Controlled -Continue HCTZ 12.5 mg daily -Continue lifestyle  modifications -Plan: Hydrochlorothiazide 12.5 mg  Follow-up as needed  Grier Mitts, MD

## 2020-03-17 LAB — COMPLETE METABOLIC PANEL WITH GFR
AG Ratio: 1.4 (calc) (ref 1.0–2.5)
ALT: 11 U/L (ref 6–29)
AST: 11 U/L (ref 10–35)
Albumin: 3.8 g/dL (ref 3.6–5.1)
Alkaline phosphatase (APISO): 59 U/L (ref 31–125)
BUN: 14 mg/dL (ref 7–25)
CO2: 22 mmol/L (ref 20–32)
Calcium: 9.3 mg/dL (ref 8.6–10.2)
Chloride: 105 mmol/L (ref 98–110)
Creat: 0.67 mg/dL (ref 0.50–1.10)
GFR, Est African American: 120 mL/min/{1.73_m2} (ref >=60)
GFR, Est Non African American: 104 mL/min/{1.73_m2} (ref >=60)
Globulin: 2.7 g/dL (calc) (ref 1.9–3.7)
Glucose, Bld: 98 mg/dL (ref 65–99)
Potassium: 4 mmol/L (ref 3.5–5.3)
Sodium: 140 mmol/L (ref 135–146)
Total Bilirubin: 0.4 mg/dL (ref 0.2–1.2)
Total Protein: 6.5 g/dL (ref 6.1–8.1)

## 2020-03-17 LAB — CBC WITH DIFFERENTIAL/PLATELET
Absolute Monocytes: 403 cells/uL (ref 200–950)
Basophils Absolute: 20 cells/uL (ref 0–200)
Basophils Relative: 0.3 %
Eosinophils Absolute: 111 cells/uL (ref 15–500)
Eosinophils Relative: 1.7 %
HCT: 40.5 % (ref 35.0–45.0)
Hemoglobin: 13.8 g/dL (ref 11.7–15.5)
Lymphs Abs: 2386 cells/uL (ref 850–3900)
MCH: 30.2 pg (ref 27.0–33.0)
MCHC: 34.1 g/dL (ref 32.0–36.0)
MCV: 88.6 fL (ref 80.0–100.0)
MPV: 8.9 fL (ref 7.5–12.5)
Monocytes Relative: 6.2 %
Neutro Abs: 3582 cells/uL (ref 1500–7800)
Neutrophils Relative %: 55.1 %
Platelets: 249 10*3/uL (ref 140–400)
RBC: 4.57 10*6/uL (ref 3.80–5.10)
RDW: 12.9 % (ref 11.0–15.0)
Total Lymphocyte: 36.7 %
WBC: 6.5 10*3/uL (ref 3.8–10.8)

## 2020-03-17 LAB — LIPID PANEL
Cholesterol: 220 mg/dL — ABNORMAL HIGH (ref <200)
HDL: 60 mg/dL (ref >=50)
LDL Cholesterol (Calc): 108 mg/dL (calc) — ABNORMAL HIGH
Non-HDL Cholesterol (Calc): 160 mg/dL (calc) — ABNORMAL HIGH (ref <130)
Total CHOL/HDL Ratio: 3.7 (calc) (ref <5.0)
Triglycerides: 367 mg/dL — ABNORMAL HIGH (ref <150)

## 2020-03-17 LAB — HEMOGLOBIN A1C
Hgb A1c MFr Bld: 5 % of total Hgb (ref <5.7)
Mean Plasma Glucose: 97 mg/dL
eAG (mmol/L): 5.4 mmol/L

## 2020-03-17 LAB — TSH: TSH: 2.06 mIU/L

## 2020-03-17 LAB — T4, FREE: Free T4: 1.1 ng/dL (ref 0.8–1.8)

## 2020-11-23 ENCOUNTER — Other Ambulatory Visit: Payer: Self-pay | Admitting: Obstetrics

## 2020-11-23 DIAGNOSIS — R1900 Intra-abdominal and pelvic swelling, mass and lump, unspecified site: Secondary | ICD-10-CM

## 2020-11-24 ENCOUNTER — Ambulatory Visit
Admission: RE | Admit: 2020-11-24 | Discharge: 2020-11-24 | Disposition: A | Discharge status: Home / Self Care | Payer: BC Managed Care – PPO | Source: Ambulatory Visit | Attending: Obstetrics | Admitting: Obstetrics

## 2020-11-24 DIAGNOSIS — R1900 Intra-abdominal and pelvic swelling, mass and lump, unspecified site: Secondary | ICD-10-CM

## 2020-11-24 MED ORDER — IOPAMIDOL (ISOVUE-300) INJECTION 61%
100.0000 mL | Freq: Once | INTRAVENOUS | Status: AC | PRN
  Administered 2020-11-24: 100 mL via INTRAVENOUS

## 2020-11-25 DIAGNOSIS — R1900 Intra-abdominal and pelvic swelling, mass and lump, unspecified site: Secondary | ICD-10-CM | POA: Insufficient documentation

## 2020-11-27 ENCOUNTER — Telehealth: Payer: Self-pay

## 2020-11-27 NOTE — Telephone Encounter (Signed)
Spoke with Dorothy Taylor regarding her referral to GYN oncology. She has an appointment scheduled with Dr. Denman George on 8/26 at [place]. Patient agrees to date and time. She has been provided with office address and location. She is also aware of our mask and visitor policy. Patient verbalized understanding and will call with any questions.

## 2020-11-30 ENCOUNTER — Other Ambulatory Visit: Payer: Self-pay | Admitting: Obstetrics

## 2020-11-30 DIAGNOSIS — R1900 Intra-abdominal and pelvic swelling, mass and lump, unspecified site: Secondary | ICD-10-CM

## 2020-12-02 ENCOUNTER — Telehealth: Payer: Self-pay

## 2020-12-02 NOTE — Telephone Encounter (Signed)
Spoke with Dorothy Taylor regarding her appointments. Patient unable to come on 9/6 as [place] the beach [place]11. Patient called and moved her MRI appointment to 8/25. Her appointment with Dr. Denman George has been moved to 8/31 at 11:15. Patient verbalized understanding and is in agreement of date and time.

## 2020-12-02 NOTE — Telephone Encounter (Signed)
Left message for [facility] requesting a return call. MRI has been scheduled for 9/2, her appointment with GYN Oncology needs to be moved until after her MRI. Appointment has been moved to 9/6 with Dr. Denman George at 10:30.

## 2020-12-03 ENCOUNTER — Other Ambulatory Visit: Payer: Self-pay

## 2020-12-03 ENCOUNTER — Ambulatory Visit
Admission: RE | Admit: 2020-12-03 | Discharge: 2020-12-03 | Disposition: A | Discharge status: Home / Self Care | Payer: BC Managed Care – PPO | Source: Ambulatory Visit | Attending: Obstetrics | Admitting: Obstetrics

## 2020-12-03 DIAGNOSIS — R1900 Intra-abdominal and pelvic swelling, mass and lump, unspecified site: Secondary | ICD-10-CM

## 2020-12-03 MED ORDER — GADOBENATE DIMEGLUMINE 529 MG/ML IV SOLN
20.0000 mL | Freq: Once | INTRAVENOUS | Status: AC | PRN
  Administered 2020-12-03: 20 mL via INTRAVENOUS

## 2020-12-04 ENCOUNTER — Inpatient Hospital Stay: Payer: BC Managed Care – PPO | Admitting: Gynecologic Oncology

## 2020-12-09 ENCOUNTER — Inpatient Hospital Stay (HOSPITAL_BASED_OUTPATIENT_CLINIC_OR_DEPARTMENT_OTHER): Payer: BC Managed Care – PPO | Admitting: Gynecologic Oncology

## 2020-12-09 ENCOUNTER — Other Ambulatory Visit: Payer: Self-pay

## 2020-12-09 ENCOUNTER — Encounter: Payer: Self-pay | Admitting: Gynecologic Oncology

## 2020-12-09 ENCOUNTER — Inpatient Hospital Stay: Payer: BC Managed Care – PPO | Attending: Gynecologic Oncology | Admitting: Gynecologic Oncology

## 2020-12-09 VITALS — BP 133/91 | HR 73 | Temp 99.1°F | Resp 18 | Ht 66.0 in | Wt 250.6 lb

## 2020-12-09 DIAGNOSIS — Z79899 Other long term (current) drug therapy: Secondary | ICD-10-CM | POA: Insufficient documentation

## 2020-12-09 DIAGNOSIS — D251 Intramural leiomyoma of uterus: Secondary | ICD-10-CM

## 2020-12-09 DIAGNOSIS — E669 Obesity, unspecified: Secondary | ICD-10-CM

## 2020-12-09 DIAGNOSIS — E7800 Pure hypercholesterolemia, unspecified: Secondary | ICD-10-CM | POA: Insufficient documentation

## 2020-12-09 DIAGNOSIS — R350 Frequency of micturition: Secondary | ICD-10-CM | POA: Insufficient documentation

## 2020-12-09 DIAGNOSIS — N920 Excessive and frequent menstruation with regular cycle: Secondary | ICD-10-CM | POA: Insufficient documentation

## 2020-12-09 DIAGNOSIS — R109 Unspecified abdominal pain: Secondary | ICD-10-CM | POA: Insufficient documentation

## 2020-12-09 MED ORDER — TRAMADOL HCL 50 MG PO TABS: 50.0000 mg | ORAL_TABLET | Freq: Four times a day (QID) | ORAL | 0 refills | Status: DC | PRN

## 2020-12-09 MED ORDER — IBUPROFEN 800 MG PO TABS
800.0000 mg | ORAL_TABLET | Freq: Three times a day (TID) | ORAL | 0 refills | Status: DC | PRN
Start: 2020-12-09 — End: 2021-03-17

## 2020-12-09 MED ORDER — SENNOSIDES-DOCUSATE SODIUM 8.6-50 MG PO TABS: 2.0000 | ORAL_TABLET | Freq: Every day | ORAL | 0 refills | Status: DC

## 2020-12-09 NOTE — H&P (View-Only) (Signed)
Consult Note: Gyn-Onc  Consult was requested by Dr. Pamala Taylor for the evaluation of Dorothy Taylor 50 y.o. female  CC:  Chief Complaint  Patient presents with   Uterine mass   fibroid uterus    Assessment/Plan:  Ms. Dorothy Taylor  is a 50 y.o.  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   Together we looked at MRI images with the patient and her wife.  Based on its appearance on MRI, her age, physical exam and symptom profile, I feel this is most consistent with a benign fibroid.  Given how symptomatic she is from a with abnormal bleeding and mass-effect symptoms, I endorsed proceeding with surgical intervention with hysterectomy.  I would not recommend oophorectomy given her negative BRCA testing and premenopausal age.  Based on my evaluation on imaging and exam I think she would be a candidate for a minimally invasive approach with robotic assistance if I were to perform the surgery.  However I also think it is reasonable for her gynecologist, Dr. Pamala Taylor, to proceed as planned with a hysterectomy given my low suspicion for malignancy.   If she would undergo a minimally invasive procedure for hysterectomy she would require mini laparotomy for specimen delivery with contained morcellation, as vaginal morcellation would not be technically feasible.  The patient asked for me to proceed with her surgery and we scheduled this as a robotic assisted total hysterectomy>250gm, bilateral salpingectomy and minilaparotomy for 01/04/21. The patient was informed that I am leaving the practice on January 08, 2021 and will not be available to her for postoperative care including in the case of development of complications.  I explained that I will have partners in my practice will be able to manage her in the postoperative period including in the setting of complications, and the patient felt comfortable proceeding with this arrangement.   HPI: [name] is a 50 year old P0 who was seen in consultation at the request of Dr Dorothy Taylor for evaluation of a large uterine fibroid tumor.  The patient first began experiencing symptoms approximately 3 months prior to presentation with pressure on her abdomen with laying prone.  She has had longstanding history of menorrhagia treated with birth control pills with some but not complete effect.  She had never had preceding uterine imaging such as ultrasound.  After noting this discomfort in her abdomen with prone lying and some increased urinary frequency she mentioned the symptoms to her gynecologist, Dr. Pamala Taylor, who performed a transvaginal ultrasound scan on 11/16/2020 which identified an enlarged bulbous uterus measuring 16.6 x 12.9 x 11.6 cm with some irregular endometrium and internal vascularity.  She underwent endometrial biopsy which per patient was benign.  She then underwent a CT scan of the abdomen and pelvis on 11/24/2020 which revealed a mass within the uterus measuring 12.9 x 11.4 x 10.7 cm.  There was mild heterogeneity representing internal cystic degeneration.  The MRI of the pelvis was obtained on 12/03/2020 and revealed a single well-circumscribed intramural mass seen within the posterior corpus and fundus displacing the endometrial cavity.  It measured 12.3 cm in maximum dimension and showed a few small internal areas of cystic degeneration.  It was felt to be consistent with a large uterine fibroid.  The cervix and vagina were normal and there was no gross extrauterine disease disease identified.  There was no evidence of endometrial thickening on MRI.  Her gynecologist recommended gynecologic oncology consultation prior to proceeding with  hysterectomy.  Her medical history is most significant for obesity, hypercholesterolemai.  Her surgical history is most significant for no prior surgeries.  Her gynecologic history is remarkable for abnormal menses. BRCA testing  negative.  Her family cancer history is sigificant for breast and pancreatic cancer. The patient's BRCA testing was negative.  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   Current Meds:  Outpatient Encounter Medications as of 12/09/2020  Medication Sig   Ascorbic Acid (VITAMIN C) 1000 MG tablet Take 1,000 mg by mouth daily.   escitalopram (LEXAPRO) 10 MG tablet Take 1 tablet (10 mg total) by mouth daily.   fluticasone (FLONASE) 50 MCG/ACT nasal spray Place 1 spray into both nostrils daily. Still taking daily   hydrochlorothiazide (HYDRODIURIL) 12.5 MG tablet Take 1 tablet (12.5 mg total) by mouth daily.   MEGARED OMEGA-3 KRILL OIL PO Take 800 mg by mouth daily.   OMEGA-3 KRILL OIL PO Take by mouth.   Probiotic Product (PROBIOTIC PO) Probiotic   PSYLLIUM HUSK PO Take 1,400 mg by mouth daily.   Rhodiola rosea (RHODIOLA PO) Take 340 mg by mouth daily.   Drospirenone (SLYND) 4 MG TABS Slynd 4 mg (28) tablet  Take 1 tablet every day by oral route.   drospirenone-ethinyl estradiol (YAZ) 3-0.02 MG tablet Take 1 tablet by mouth daily. (Patient not taking: Reported on 12/09/2020)   No facility-administered encounter medications on file as of 12/09/2020.    Allergy: No Known Allergies  Social Hx:   Social History   Socioeconomic History   Marital status: Married    Spouse name: Not on file   Number of children: Not on file   Years of education: Not on file   Highest education level: Not on file  Occupational History   Not on file  Tobacco Use   Smoking status: Never   Smokeless tobacco: Never  Substance and Sexual Activity   Alcohol use: Yes   Drug use: Never   Sexual activity: Yes  Other Topics Concern   Not on file  Social History Narrative   Not on file   Social Determinants of Health   Financial Resource Strain: Not on file  Food Insecurity: Not on file  Transportation Needs: Not on file  Physical Activity: Not on file  Stress: Not on  file  Social Connections: Not on file  Intimate Partner Violence: Not on file    Past Surgical Hx: History reviewed. No pertinent surgical history.  Past Medical Hx:  Past Medical History:  Diagnosis Date   Depression    Hyperlipidemia    Hypertension     Past Gynecological History:  see HPI No LMP recorded.  Family Hx:  Family History  Problem Relation Age of Onset   Hypertension Mother    Hyperlipidemia Mother    Hyperlipidemia Father    Hypertension Father    Heart attack Father    Depression Father    Cancer Father    Asthma Sister    Depression Sister    Hypertension Maternal Grandmother    Hyperlipidemia Maternal Grandmother    Hypertension Maternal Grandfather    Hyperlipidemia Maternal Grandfather    Heart disease Maternal Grandfather    Heart attack Maternal Grandfather     Review of Systems:  Constitutional  Feels well,    ENT Normal appearing ears and nares bilaterally Skin/Breast  No rash, sores, jaundice, itching, dryness Cardiovascular  No chest pain, shortness of breath, or edema  Pulmonary  No cough or wheeze.  Gastro Intestinal  No nausea, vomitting, or diarrhoea. No bright red blood per rectum, no abdominal pain, change in bowel movement, or constipation.  Genito Urinary  No frequency, urgency, dysuria, + abnormal menses Musculo Skeletal  No myalgia, arthralgia, joint swelling or pain  Neurologic  No weakness, numbness, change in gait,  Psychology  No depression, anxiety, insomnia.   Vitals:  Blood pressure (!) 133/91, pulse 73, temperature 99.1 F (37.3 C), temperature source Oral, resp. rate 18, height _0  (1.676 m), weight 250 lb 9.6 oz (113.7 kg), SpO2 98 %.  Physical Exam: WD in NAD Neck  Supple NROM, without any enlargements.  Lymph Node Survey No cervical supraclavicular or inguinal adenopathy Cardiovascular  Pulse normal rate, regularity and rhythm. S1 and S2 normal.  Lungs  Clear to auscultation bilateraly, without  wheezes/crackles/rhonchi. Good air movement.  Skin  No rash/lesions/breakdown  Psychiatry  Alert and oriented to person, place, and time  Abdomen  Normoactive bowel sounds, abdomen soft, non-tender and obese without evidence of hernia. Back No CVA tenderness Genito Urinary  Vulva/vagina: Normal external female genitalia.   No lesions. No discharge or bleeding.  Bladder/urethra:  No lesions or masses, well supported bladder  Vagina: long, narrow  Cervix: Difficult to visualize. Palpably small. Normal appearing, no lesions.  Uterus: Unable to clearly appreciate uterine size due to body habitus and uterine mass pulling the cervix anteriorly.  The lower uterine segment though feels somewhat mobile with free space in the pelvis.   Adnexa: no discretely palpable masses. Rectal  deferred Extremities  No bilateral cyanosis, clubbing or edema.  60 minutes of total time was spent for this patient encounter, including preparation, face-to-face counseling with the patient and coordination of care, review of imaging (results and images), communication with the referring provider and documentation of the encounter.   Thereasa Solo, MD  12/09/2020, 12:15 PM

## 2020-12-09 NOTE — Progress Notes (Signed)
Consult Note: Gyn-Onc  Consult was requested by Dr. Pamala Hurry for the evaluation of Dorothy Taylor 50 y.o. female  CC:  Chief Complaint  Patient presents with   Uterine mass   fibroid uterus    Assessment/Plan:  Dorothy Taylor  is a 50 y.o.  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   Together we looked at MRI images with the patient and her wife.  Based on its appearance on MRI, her age, physical exam and symptom profile, I feel this is most consistent with a benign fibroid.  Given how symptomatic she is from a with abnormal bleeding and mass-effect symptoms, I endorsed proceeding with surgical intervention with hysterectomy.  I would not recommend oophorectomy given her negative BRCA testing and premenopausal age.  Based on my evaluation on imaging and exam I think she would be a candidate for a minimally invasive approach with robotic assistance if I were to perform the surgery.  However I also think it is reasonable for her gynecologist, Dr. Pamala Hurry, to proceed as planned with a hysterectomy given my low suspicion for malignancy.   If she would undergo a minimally invasive procedure for hysterectomy she would require mini laparotomy for specimen delivery with contained morcellation, as vaginal morcellation would not be technically feasible.  The patient asked for me to proceed with her surgery and we scheduled this as a robotic assisted total hysterectomy>250gm, bilateral salpingectomy and minilaparotomy for 01/04/21. The patient was informed that I am leaving the practice on January 08, 2021 and will not be available to her for postoperative care including in the case of development of complications.  I explained that I will have partners in my practice will be able to manage her in the postoperative period including in the setting of complications, and the patient felt comfortable proceeding with this arrangement.   HPI: [name] is a 50 year old P0 who was seen in consultation at the request of Dr Pamala Hurry for evaluation of a large uterine fibroid tumor.  The patient first began experiencing symptoms approximately 3 months prior to presentation with pressure on her abdomen with laying prone.  She has had longstanding history of menorrhagia treated with birth control pills with some but not complete effect.  She had never had preceding uterine imaging such as ultrasound.  After noting this discomfort in her abdomen with prone lying and some increased urinary frequency she mentioned the symptoms to her gynecologist, Dr. Pamala Hurry, who performed a transvaginal ultrasound scan on 11/16/2020 which identified an enlarged bulbous uterus measuring 16.6 x 12.9 x 11.6 cm with some irregular endometrium and internal vascularity.  She underwent endometrial biopsy which per patient was benign.  She then underwent a CT scan of the abdomen and pelvis on 11/24/2020 which revealed a mass within the uterus measuring 12.9 x 11.4 x 10.7 cm.  There was mild heterogeneity representing internal cystic degeneration.  The MRI of the pelvis was obtained on 12/03/2020 and revealed a single well-circumscribed intramural mass seen within the posterior corpus and fundus displacing the endometrial cavity.  It measured 12.3 cm in maximum dimension and showed a few small internal areas of cystic degeneration.  It was felt to be consistent with a large uterine fibroid.  The cervix and vagina were normal and there was no gross extrauterine disease disease identified.  There was no evidence of endometrial thickening on MRI.  Her gynecologist recommended gynecologic oncology consultation prior to proceeding with  hysterectomy.  Her medical history is most significant for obesity, hypercholesterolemai.  Her surgical history is most significant for no prior surgeries.  Her gynecologic history is remarkable for abnormal menses. BRCA testing  negative.  Her family cancer history is sigificant for breast and pancreatic cancer. The patient's BRCA testing was negative.  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   Current Meds:  Outpatient Encounter Medications as of 12/09/2020  Medication Sig   Ascorbic Acid (VITAMIN C) 1000 MG tablet Take 1,000 mg by mouth daily.   escitalopram (LEXAPRO) 10 MG tablet Take 1 tablet (10 mg total) by mouth daily.   fluticasone (FLONASE) 50 MCG/ACT nasal spray Place 1 spray into both nostrils daily. Still taking daily   hydrochlorothiazide (HYDRODIURIL) 12.5 MG tablet Take 1 tablet (12.5 mg total) by mouth daily.   MEGARED OMEGA-3 KRILL OIL PO Take 800 mg by mouth daily.   OMEGA-3 KRILL OIL PO Take by mouth.   Probiotic Product (PROBIOTIC PO) Probiotic   PSYLLIUM HUSK PO Take 1,400 mg by mouth daily.   Rhodiola rosea (RHODIOLA PO) Take 340 mg by mouth daily.   Drospirenone (SLYND) 4 MG TABS Slynd 4 mg (28) tablet  Take 1 tablet every day by oral route.   drospirenone-ethinyl estradiol (YAZ) 3-0.02 MG tablet Take 1 tablet by mouth daily. (Patient not taking: Reported on 12/09/2020)   No facility-administered encounter medications on file as of 12/09/2020.    Allergy: No Known Allergies  Social Hx:   Social History   Socioeconomic History   Marital status: Married    Spouse name: Not on file   Number of children: Not on file   Years of education: Not on file   Highest education level: Not on file  Occupational History   Not on file  Tobacco Use   Smoking status: Never   Smokeless tobacco: Never  Substance and Sexual Activity   Alcohol use: Yes   Drug use: Never   Sexual activity: Yes  Other Topics Concern   Not on file  Social History Narrative   Not on file   Social Determinants of Health   Financial Resource Strain: Not on file  Food Insecurity: Not on file  Transportation Needs: Not on file  Physical Activity: Not on file  Stress: Not on  file  Social Connections: Not on file  Intimate Partner Violence: Not on file    Past Surgical Hx: History reviewed. No pertinent surgical history.  Past Medical Hx:  Past Medical History:  Diagnosis Date   Depression    Hyperlipidemia    Hypertension     Past Gynecological History:  see HPI No LMP recorded.  Family Hx:  Family History  Problem Relation Age of Onset   Hypertension Mother    Hyperlipidemia Mother    Hyperlipidemia Father    Hypertension Father    Heart attack Father    Depression Father    Cancer Father    Asthma Sister    Depression Sister    Hypertension Maternal Grandmother    Hyperlipidemia Maternal Grandmother    Hypertension Maternal Grandfather    Hyperlipidemia Maternal Grandfather    Heart disease Maternal Grandfather    Heart attack Maternal Grandfather     Review of Systems:  Constitutional  Feels well,    ENT Normal appearing ears and nares bilaterally Skin/Breast  No rash, sores, jaundice, itching, dryness Cardiovascular  No chest pain, shortness of breath, or edema  Pulmonary  No cough or wheeze.  Gastro Intestinal  No nausea, vomitting, or diarrhoea. No bright red blood per rectum, no abdominal pain, change in bowel movement, or constipation.  Genito Urinary  No frequency, urgency, dysuria, + abnormal menses Musculo Skeletal  No myalgia, arthralgia, joint swelling or pain  Neurologic  No weakness, numbness, change in gait,  Psychology  No depression, anxiety, insomnia.   Vitals:  Blood pressure (!) 133/91, pulse 73, temperature 99.1 F (37.3 C), temperature source Oral, resp. rate 18, height _0  (1.676 m), weight 250 lb 9.6 oz (113.7 kg), SpO2 98 %.  Physical Exam: WD in NAD Neck  Supple NROM, without any enlargements.  Lymph Node Survey No cervical supraclavicular or inguinal adenopathy Cardiovascular  Pulse normal rate, regularity and rhythm. S1 and S2 normal.  Lungs  Clear to auscultation bilateraly, without  wheezes/crackles/rhonchi. Good air movement.  Skin  No rash/lesions/breakdown  Psychiatry  Alert and oriented to person, place, and time  Abdomen  Normoactive bowel sounds, abdomen soft, non-tender and obese without evidence of hernia. Back No CVA tenderness Genito Urinary  Vulva/vagina: Normal external female genitalia.   No lesions. No discharge or bleeding.  Bladder/urethra:  No lesions or masses, well supported bladder  Vagina: long, narrow  Cervix: Difficult to visualize. Palpably small. Normal appearing, no lesions.  Uterus: Unable to clearly appreciate uterine size due to body habitus and uterine mass pulling the cervix anteriorly.  The lower uterine segment though feels somewhat mobile with free space in the pelvis.   Adnexa: no discretely palpable masses. Rectal  deferred Extremities  No bilateral cyanosis, clubbing or edema.  60 minutes of total time was spent for this patient encounter, including preparation, face-to-face counseling with the patient and coordination of care, review of imaging (results and images), communication with the referring provider and documentation of the encounter.   Thereasa Solo, MD  12/09/2020, 12:15 PM

## 2020-12-09 NOTE — Patient Instructions (Addendum)
Dr Denman George has a low suspicious that this is a cancerous fibroid. She thinks that it is safe for a general gynecologist to proceed with hysterectomy (if they feel comfortable with the technical difficulty of the procedure).  She recommends that you not remove your ovaries as you are at "average risk" for ovarian cancer. It is reasonable to consider performing this procedure through small incision surgery, provided there is not uncontained power morcellation of the specimen to remove it.   Preparing for your Surgery  Plan for surgery on January 05, 2021 with Dr. Everitt Amber at [place] will be scheduled for a robotic assisted total laparoscopic hysterectomy, bilateral salpingectomy (removal of the fallopian tubes), mini laparotomy (larger incision on your abdomen to remove the uterus and fallopian tubes).   Pre-operative Testing -You will receive a phone call from presurgical testing at [facility] to arrange for a pre-operative appointment and lab work.  -Bring your insurance card, copy of an advanced directive if applicable, medication list  -At that visit, you will be asked to sign a consent for a possible blood transfusion in case a transfusion becomes necessary during surgery.  The need for a blood transfusion is rare but having consent is a necessary part of your care.     -You should not be taking blood thinners or aspirin at least ten days prior to surgery unless instructed by your surgeon.  -Do not take supplements such as fish oil (omega 3), red yeast rice, turmeric before your surgery. You want to avoid medications with aspirin in them including headache powders such as BC or Goody's), Excedrin migraine.  Day Before Surgery at [place] will be asked to take in a light diet the day before surgery. You will be advised you can have clear liquids up until 3 hours before your surgery.    Eat a light diet the day before surgery.  Examples including soups, broths,  toast, yogurt, mashed potatoes.  AVOID GAS PRODUCING FOODS. Things to avoid include carbonated beverages (fizzy beverages, sodas), raw fruits and raw vegetables (uncooked), or beans.   If your bowels are filled with gas, your surgeon will have difficulty visualizing your pelvic organs which increases your surgical risks.  Your role in recovery Your role is to become active as soon as directed by your doctor, while still giving yourself time to heal.  Rest when you feel tired. You will be asked to do the following in order to speed your recovery:  - Cough and breathe deeply. This helps to clear and expand your lungs and can prevent pneumonia after surgery.  - [place]. Do mild physical activity. Walking or moving your legs help your circulation and body functions return to normal. Do not try to get up or walk alone the first time after surgery.   -If you develop swelling on one leg or the other, pain in the back of your leg, redness/warmth in one of your legs, please call the office or go to the Emergency Room to have a doppler to rule out a blood clot. For shortness of breath, chest pain-seek care in the Emergency Room as soon as possible. - Actively manage your pain. Managing your pain lets you move in comfort. We will ask you to rate your pain on a scale of zero to 10. It is your responsibility to tell your doctor or nurse where and how much you hurt so your pain can be treated.  Special Considerations -  If you are diabetic, you may be placed on insulin after surgery to have closer control over your blood sugars to promote healing and recovery.  This does not mean that you will be discharged on insulin.  If applicable, your oral antidiabetics will be resumed when you are tolerating a solid diet.  -Your final pathology results from surgery should be available around one week after surgery and the results will be relayed to you when available.  -Dr. Lahoma Crocker is the  surgeon that assists your GYN Oncologist with surgery.  If you end up staying the night, the next day after your surgery you will either see Dr. Denman George, Dr. Berline Lopes, or Dr. Lahoma Crocker.  -FMLA forms can be faxed to [phone] and please allow 5-7 business days for completion.  Pain Management After Surgery -You have been prescribed your pain medication and bowel regimen medications before surgery so that you can have these available when you are discharged from the hospital. The pain medication is for use ONLY AFTER surgery and a new prescription will not be given.   -Make sure that you have Tylenol and Ibuprofen at home to use on a regular basis after surgery for pain control. We recommend alternating the medications every hour to six hours since they work differently and are processed in the body differently for pain relief.  THERE CAN BE AN INCREASED RISK OF GASTROINTESTINAL BLEEDING WITH THE USE OF IBUPROFEN AND LEXAPRO. WE WOULD RECOMMEND USING THE IBUPROFEN SPARINGLY AND TAKE WITH FOOD.   -Review the attached handout on narcotic use and their risks and side effects.   Bowel Regimen -You have been prescribed Sennakot-S to take nightly to prevent constipation especially if you are taking the narcotic pain medication intermittently.  It is important to prevent constipation and drink adequate amounts of liquids. You can stop taking this medication when you are not taking pain medication and you are back on your normal bowel routine.  Risks of Surgery Risks of surgery are low but include bleeding, infection, damage to surrounding structures, re-operation, blood clots, and very rarely death.   Blood Transfusion Information (For the consent to be signed before surgery)  We will be checking your blood type before surgery so in case of emergencies, we will know what type of blood you would need.                                            WHAT IS A BLOOD TRANSFUSION?  A transfusion is  the replacement of blood or some of its parts. Blood is made up of multiple cells which provide different functions. Red blood cells carry oxygen and are used for blood loss replacement. White blood cells fight against infection. Platelets control bleeding. Plasma helps clot blood. Other blood products are available for specialized needs, such as hemophilia or other clotting disorders. BEFORE THE TRANSFUSION  Who gives blood for transfusions?  You may be able to donate blood to be used at a later date on yourself (autologous donation). Relatives can be asked to donate blood. This is generally not any safer than if you have received blood from a stranger. The same precautions are taken to ensure safety when a relative's blood is donated. Healthy volunteers who are fully evaluated to make sure their blood is safe. This is blood bank blood. Transfusion therapy is the safest it has ever been  in the practice of medicine. Before blood is taken from a donor, a complete history is taken to make sure that person has no history of diseases nor engages in risky social behavior (examples are intravenous drug use or sexual activity with multiple partners). The donor's travel history is screened to minimize risk of transmitting infections, such as malaria. The donated blood is tested for signs of infectious diseases, such as HIV and hepatitis. The blood is then tested to be sure it is compatible with you in order to minimize the chance of a transfusion reaction. If you or a relative donates blood, this is often done in anticipation of surgery and is not appropriate for emergency situations. It takes many days to process the donated blood. RISKS AND COMPLICATIONS Although transfusion therapy is very safe and saves many lives, the main dangers of transfusion include:  Getting an infectious disease. Developing a transfusion reaction. This is an allergic reaction to something in the blood you were given. Every  precaution is taken to prevent this. The decision to have a blood transfusion has been considered carefully by your caregiver before blood is given. Blood is not given unless the benefits outweigh the risks.  AFTER SURGERY INSTRUCTIONS  Return to work: 4 weeks if applicable  Activity: 1. Be up and out of the bed during the day.  Take a nap if needed.  You may walk up steps but be careful and use the hand rail.  Stair climbing will tire you more than you think, you may need to stop part way and rest.   2. No lifting or straining for 6 weeks over 10 pounds. No pushing, pulling, straining for 6 weeks.  3. No driving for 1 week(s).  Do not drive if you are taking narcotic pain medicine and make sure that your reaction time has returned.   4. You can shower as soon as the next day after surgery. Shower daily.  Use your regular soap and water (not directly on the incision) and pat your incision(s) dry afterwards; don't rub.  No tub baths or submerging your body in water until cleared by your surgeon. If you have the soap that was given to you by pre-surgical testing that was used before surgery, you do not need to use it afterwards because this can irritate your incisions.   5. No sexual activity and nothing in the vagina for 8 weeks.  6. You may experience a small amount of clear drainage from your incisions, which is normal.  If the drainage persists, increases, or changes color please call the office.  7. Do not use creams, lotions, or ointments such as neosporin on your incisions after surgery until advised by your surgeon because they can cause removal of the dermabond glue on your incisions.    8. You may experience vaginal spotting after surgery or around the 6-8 week mark from surgery when the stitches at the top of the vagina begin to dissolve.  The spotting is normal but if you experience heavy bleeding, call our office.  9. Take Tylenol or ibuprofen first for pain and only use narcotic  pain medication for severe pain not relieved by the Tylenol or Ibuprofen.  Monitor your Tylenol intake to a max of 4,000 mg in a 24 hour period. You can alternate these medications after surgery.  Diet: 1. Low sodium Heart Healthy Diet is recommended but you are cleared to resume your normal (before surgery) diet after your procedure.  2. It is safe  to use a laxative, such as Miralax or Colace, if you have difficulty moving your bowels. You have been prescribed Sennakot at bedtime every evening to keep bowel movements regular and to prevent constipation.    Wound Care: 1. Keep clean and dry.  Shower daily.  Reasons to call the Doctor: Fever - Oral temperature greater than 100.4 degrees Fahrenheit Foul-smelling vaginal discharge Difficulty urinating Nausea and vomiting Increased pain at the site of the incision that is unrelieved with pain medicine. Difficulty breathing with or without chest pain New calf pain especially if only on one side Sudden, continuing increased vaginal bleeding with or without clots.   Contacts: For questions or concerns you should contact:  Dr. Everitt Amber at [phone]  Joylene John, NP at [phone]  After Hours: call [phone] and have the GYN Oncologist paged/contacted (after 5 pm or on the weekends).  Messages sent via mychart are for non-urgent matters and are not responded to after hours so for urgent needs, please call the after hours number.

## 2020-12-11 ENCOUNTER — Other Ambulatory Visit: Payer: Self-pay | Admitting: Gynecologic Oncology

## 2020-12-11 ENCOUNTER — Other Ambulatory Visit: Payer: BC Managed Care – PPO

## 2020-12-11 DIAGNOSIS — D251 Intramural leiomyoma of uterus: Secondary | ICD-10-CM

## 2020-12-11 NOTE — Patient Instructions (Signed)
Preparing for your Surgery   Plan for surgery on January 05, 2021 with Dr. Everitt Amber at [place] will be scheduled for a robotic assisted total laparoscopic hysterectomy, bilateral salpingectomy (removal of the fallopian tubes), mini laparotomy (larger incision on your abdomen to remove the uterus and fallopian tubes).    Pre-operative Testing -You will receive a phone call from presurgical testing at [facility] to arrange for a pre-operative appointment and lab work.   -Bring your insurance card, copy of an advanced directive if applicable, medication list   -At that visit, you will be asked to sign a consent for a possible blood transfusion in case a transfusion becomes necessary during surgery.  The need for a blood transfusion is rare but having consent is a necessary part of your care.      -You should not be taking blood thinners or aspirin at least ten days prior to surgery unless instructed by your surgeon.   -Do not take supplements such as fish oil (omega 3), red yeast rice, turmeric before your surgery. You want to avoid medications with aspirin in them including headache powders such as BC or Goody's), Excedrin migraine.   Day Before Surgery at [place] will be asked to take in a light diet the day before surgery. You will be advised you can have clear liquids up until 3 hours before your surgery.     Eat a light diet the day before surgery.  Examples including soups, broths, toast, yogurt, mashed potatoes.  AVOID GAS PRODUCING FOODS. Things to avoid include carbonated beverages (fizzy beverages, sodas), raw fruits and raw vegetables (uncooked), or beans.    If your bowels are filled with gas, your surgeon will have difficulty visualizing your pelvic organs which increases your surgical risks.   Your role in recovery Your role is to become active as soon as directed by your doctor, while still giving yourself time to heal.  Rest when you feel tired.  You will be asked to do the following in order to speed your recovery:   - Cough and breathe deeply. This helps to clear and expand your lungs and can prevent pneumonia after surgery.  - [place]. Do mild physical activity. Walking or moving your legs help your circulation and body functions return to normal. Do not try to get up or walk alone the first time after surgery.   -If you develop swelling on one leg or the other, pain in the back of your leg, redness/warmth in one of your legs, please call the office or go to the Emergency Room to have a doppler to rule out a blood clot. For shortness of breath, chest pain-seek care in the Emergency Room as soon as possible. - Actively manage your pain. Managing your pain lets you move in comfort. We will ask you to rate your pain on a scale of zero to 10. It is your responsibility to tell your doctor or nurse where and how much you hurt so your pain can be treated.   Special Considerations -If you are diabetic, you may be placed on insulin after surgery to have closer control over your blood sugars to promote healing and recovery.  This does not mean that you will be discharged on insulin.  If applicable, your oral antidiabetics will be resumed when you are tolerating a solid diet.   -Your final pathology results from surgery should be available around one week after surgery and  the results will be relayed to you when available.   -Dr. Lahoma Crocker is the surgeon that assists your GYN Oncologist with surgery.  If you end up staying the night, the next day after your surgery you will either see Dr. Denman George, Dr. Berline Lopes, or Dr. Lahoma Crocker.   -FMLA forms can be faxed to [phone] and please allow 5-7 business days for completion.   Pain Management After Surgery -You have been prescribed your pain medication and bowel regimen medications before surgery so that you can have these available when you are discharged from the  hospital. The pain medication is for use ONLY AFTER surgery and a new prescription will not be given.    -Make sure that you have Tylenol and Ibuprofen at home to use on a regular basis after surgery for pain control. We recommend alternating the medications every hour to six hours since they work differently and are processed in the body differently for pain relief.   THERE CAN BE AN INCREASED RISK OF GASTROINTESTINAL BLEEDING WITH THE USE OF IBUPROFEN AND LEXAPRO. WE WOULD RECOMMEND USING THE IBUPROFEN SPARINGLY AND TAKE WITH FOOD.    -Review the attached handout on narcotic use and their risks and side effects.    Bowel Regimen -You have been prescribed Sennakot-S to take nightly to prevent constipation especially if you are taking the narcotic pain medication intermittently.  It is important to prevent constipation and drink adequate amounts of liquids. You can stop taking this medication when you are not taking pain medication and you are back on your normal bowel routine.   Risks of Surgery Risks of surgery are low but include bleeding, infection, damage to surrounding structures, re-operation, blood clots, and very rarely death.     Blood Transfusion Information (For the consent to be signed before surgery)   We will be checking your blood type before surgery so in case of emergencies, we will know what type of blood you would need.                                             WHAT IS A BLOOD TRANSFUSION?   A transfusion is the replacement of blood or some of its parts. Blood is made up of multiple cells which provide different functions. Red blood cells carry oxygen and are used for blood loss replacement. White blood cells fight against infection. Platelets control bleeding. Plasma helps clot blood. Other blood products are available for specialized needs, such as hemophilia or other clotting disorders. BEFORE THE TRANSFUSION  Who gives blood for transfusions?  You may be able to  donate blood to be used at a later date on yourself (autologous donation). Relatives can be asked to donate blood. This is generally not any safer than if you have received blood from a stranger. The same precautions are taken to ensure safety when a relative's blood is donated. Healthy volunteers who are fully evaluated to make sure their blood is safe. This is blood bank blood. Transfusion therapy is the safest it has ever been in the practice of medicine. Before blood is taken from a donor, a complete history is taken to make sure that person has no history of diseases nor engages in risky social behavior (examples are intravenous drug use or sexual activity with multiple partners). The donor's travel history is screened to minimize risk of transmitting infections,  such as malaria. The donated blood is tested for signs of infectious diseases, such as HIV and hepatitis. The blood is then tested to be sure it is compatible with you in order to minimize the chance of a transfusion reaction. If you or a relative donates blood, this is often done in anticipation of surgery and is not appropriate for emergency situations. It takes many days to process the donated blood. RISKS AND COMPLICATIONS Although transfusion therapy is very safe and saves many lives, the main dangers of transfusion include:  Getting an infectious disease. Developing a transfusion reaction. This is an allergic reaction to something in the blood you were given. Every precaution is taken to prevent this. The decision to have a blood transfusion has been considered carefully by your caregiver before blood is given. Blood is not given unless the benefits outweigh the risks.   AFTER SURGERY INSTRUCTIONS   Return to work: 4 weeks if applicable   Activity: 1. Be up and out of the bed during the day.  Take a nap if needed.  You may walk up steps but be careful and use the hand rail.  Stair climbing will tire you more than you think, you may  need to stop part way and rest.    2. No lifting or straining for 6 weeks over 10 pounds. No pushing, pulling, straining for 6 weeks.   3. No driving for 1 week(s).  Do not drive if you are taking narcotic pain medicine and make sure that your reaction time has returned.    4. You can shower as soon as the next day after surgery. Shower daily.  Use your regular soap and water (not directly on the incision) and pat your incision(s) dry afterwards; don't rub.  No tub baths or submerging your body in water until cleared by your surgeon. If you have the soap that was given to you by pre-surgical testing that was used before surgery, you do not need to use it afterwards because this can irritate your incisions.    5. No sexual activity and nothing in the vagina for 8 weeks.   6. You may experience a small amount of clear drainage from your incisions, which is normal.  If the drainage persists, increases, or changes color please call the office.   7. Do not use creams, lotions, or ointments such as neosporin on your incisions after surgery until advised by your surgeon because they can cause removal of the dermabond glue on your incisions.     8. You may experience vaginal spotting after surgery or around the 6-8 week mark from surgery when the stitches at the top of the vagina begin to dissolve.  The spotting is normal but if you experience heavy bleeding, call our office.   9. Take Tylenol or ibuprofen first for pain and only use narcotic pain medication for severe pain not relieved by the Tylenol or Ibuprofen.  Monitor your Tylenol intake to a max of 4,000 mg in a 24 hour period. You can alternate these medications after surgery.   Diet: 1. Low sodium Heart Healthy Diet is recommended but you are cleared to resume your normal (before surgery) diet after your procedure.   2. It is safe to use a laxative, such as Miralax or Colace, if you have difficulty moving your bowels. You have been prescribed  Sennakot at bedtime every evening to keep bowel movements regular and to prevent constipation.     Wound Care: 1. Keep clean  and dry.  Shower daily.   Reasons to call the Doctor: Fever - Oral temperature greater than 100.4 degrees Fahrenheit Foul-smelling vaginal discharge Difficulty urinating Nausea and vomiting Increased pain at the site of the incision that is unrelieved with pain medicine. Difficulty breathing with or without chest pain New calf pain especially if only on one side Sudden, continuing increased vaginal bleeding with or without clots.   Contacts: For questions or concerns you should contact:   Dr. Everitt Amber at [phone]   Joylene John, NP at [phone]   After Hours: call [phone] and have the GYN Oncologist paged/contacted (after 5 pm or on the weekends).   Messages sent via mychart are for non-urgent matters and are not responded to after hours so for urgent needs, please call the after hours number.

## 2020-12-11 NOTE — Progress Notes (Signed)
Patient here for new patient consultation with Dr. Everitt Amber and for a pre-operative discussion prior to her scheduled surgery on January 05, 2021. She is scheduled for robotic assisted total laparoscopic hysterectomy, bilateral salpingectomy, mini laparotomy. The surgery was discussed in detail.  See after visit summary for additional details. Visual aids used to discuss items related to surgery including sequential compression stockings, foley catheter, IV pump, multi-modal pain regimen including tylenol, photo of the surgical robot, female reproductive system to discuss surgery in detail.      Discussed post-op pain management in detail including the aspects of the enhanced recovery pathway.  Advised her that a new prescription would be sent in for tramadol and it is only to be used for after her upcoming surgery.  We discussed the use of tylenol post-op and to monitor for a maximum of 4,000 mg in a 24 hour period.  Also prescribed sennakot to be used after surgery and to hold if having loose stools.  Discussed bowel regimen in detail.     Discussed the use of SCDs and measures to take at home to prevent DVT including frequent mobility.  Reportable signs and symptoms of DVT discussed. Post-operative instructions discussed and expectations for after surgery. Incisional care discussed as well including reportable signs and symptoms including erythema, drainage, wound separation. Discussed care for the op site dressing over the mini laparotomy incision and instructions for removal after 5 days.      10 minutes spent with the patient.  Verbalizing understanding of material discussed. No needs or concerns voiced at the end of the visit.   Advised patient and family to call for any needs.  Advised that her post-operative medications had been prescribed and could be picked up at any time.     This appointment is included in the global surgical bundle as pre-operative teaching and has no charge.

## 2020-12-15 ENCOUNTER — Ambulatory Visit: Payer: BC Managed Care – PPO | Admitting: Gynecologic Oncology

## 2020-12-22 ENCOUNTER — Encounter: Payer: Self-pay | Admitting: Gynecologic Oncology

## 2020-12-22 ENCOUNTER — Telehealth: Payer: Self-pay | Admitting: *Deleted

## 2020-12-22 NOTE — Progress Notes (Signed)
DUE TO COVID-19 ONLY ONE VISITOR IS ALLOWED TO COME WITH YOU AND STAY IN THE WAITING ROOM ONLY DURING PRE OP AND PROCEDURE DAY OF SURGERY. THE 1 VISITOR  MAY VISIT WITH YOU AFTER SURGERY IN YOUR PRIVATE ROOM DURING VISITING HOURS ONLY!  YOU NEED TO HAVE A COVID 19 TEST ON_______ '@_______'$ , THIS TEST MUST BE DONE BEFORE SURGERY,  COVID TESTING SITE IS AT [facility]. PLEASE REMAIN IN YOUR CAR THIS IS A DRIVER UP TEST. AFTER YOUR COVID TEST PLEASE WEAR A MASK OUT IN PUBLIC AND SOCIAL DISTANCE AND [place] YOUR HANDS FREQUENTLY. PLEASE ASK ALL YOUR CLOSE CONTACTS TO WEAR A MASK OUT IN PUBLIC AND SOCIAL DISTANCE AND [place] HANDS FREQUENTLY ALSO.               Dorothy Taylor  12/22/2020   Your procedure is scheduled on:  01/05/21   Report to [facility] Main  Entrance   Report to admitting at   [place] AM     Call this number if you have problems the morning of surgery [phone]    Remember: Do not eat food , candy gum or mints :After Midnight. You may have clear liquids from midnight until __ 0430am  [place] a light diet the day before surgery.  Avoid gas producing foods.     CLEAR LIQUID DIET   Foods Allowed                                                                       Coffee and tea, regular and decaf                              Plain Jell-O any favor except red or purple                                            Fruit ices (not with fruit pulp)                                      Iced Popsicles                                     Carbonated beverages, regular and diet                                    Cranberry, grape and apple juices Sports drinks like Gatorade Lightly seasoned clear broth or consume(fat free) Sugar   _____________________________________________________________________    BRUSH YOUR TEETH MORNING OF SURGERY AND RINSE YOUR MOUTH OUT, NO CHEWING GUM CANDY OR MINTS.     Take these medicines the morning of surgery with  A SIP OF WATER:  lexapro   DO NOT TAKE ANY DIABETIC MEDICATIONS DAY OF YOUR SURGERY  You may not have any metal on your body including hair pins and              piercings  Do not wear jewelry, make-up, lotions, powders or perfumes, deodorant             Do not wear nail polish on your fingernails.  Do not shave  48 hours prior to surgery.              Men may shave face and neck.   Do not bring valuables to the hospital. [place].  Contacts, dentures or bridgework may not be worn into surgery.  Leave suitcase in the car. After surgery it may be brought to your room.     Patients discharged the day of surgery will not be allowed to drive home. IF YOU ARE HAVING SURGERY AND GOING HOME THE SAME DAY, YOU MUST HAVE AN ADULT TO DRIVE YOU HOME AND BE WITH YOU FOR 24 HOURS. YOU MAY GO HOME BY TAXI OR UBER OR ORTHERWISE, BUT AN ADULT MUST ACCOMPANY YOU HOME AND STAY WITH YOU FOR 24 HOURS.  Name and phone number of your driver:  Special Instructions: N/A              Please read over the following fact sheets you were given: _____________________________________________________________________  [facility] - Preparing for Surgery Before surgery, you can play an important role.  Because skin is not sterile, your skin needs to be as free of germs as possible.  You can reduce the number of germs on your skin by washing with CHG (chlorahexidine gluconate) soap before surgery.  CHG is an antiseptic cleaner which kills germs and bonds with the skin to continue killing germs even after washing. Please DO NOT use if you have an allergy to CHG or antibacterial soaps.  If your skin becomes reddened/irritated stop using the CHG and inform your nurse when you arrive at Short Stay. Do not shave (including legs and underarms) for at least 48 hours prior to the first CHG shower.  You may shave your face/neck. Please follow these  instructions carefully:  1.  Shower with CHG Soap the night before surgery and the  morning of Surgery.  2.  If you choose to wash your hair, wash your hair first as usual with your  normal  shampoo.  3.  After you shampoo, rinse your hair and body thoroughly to remove the  shampoo.                           4.  Use CHG as you would any other liquid soap.  You can apply chg directly  to the skin and wash                       Gently with a scrungie or clean washcloth.  5.  Apply the CHG Soap to your body ONLY FROM THE NECK DOWN.   Do not use on face/ open                           Wound or open sores. Avoid contact with eyes, ears mouth and genitals (private parts).  Wash face,  Genitals (private parts) with your normal soap.             6.  Wash thoroughly, paying special attention to the area where your surgery  will be performed.  7.  Thoroughly rinse your body with warm water from the neck down.  8.  DO NOT shower/wash with your normal soap after using and rinsing off  the CHG Soap.                9.  Pat yourself dry with a clean towel.            10.  Wear clean pajamas.            11.  Place clean sheets on your bed the night of your first shower and do not  sleep with pets. Day of Surgery : Do not apply any lotions/deodorants the morning of surgery.  Please wear clean clothes to the hospital/surgery center.  FAILURE TO FOLLOW THESE INSTRUCTIONS MAY RESULT IN THE CANCELLATION OF YOUR SURGERY PATIENT SIGNATURE_________________________________  NURSE SIGNATURE__________________________________  ________________________________________________________________________

## 2020-12-22 NOTE — Telephone Encounter (Signed)
Per patient request emailed medical letter to her

## 2020-12-23 ENCOUNTER — Telehealth: Payer: Self-pay | Admitting: Gynecologic Oncology

## 2020-12-23 ENCOUNTER — Other Ambulatory Visit: Payer: Self-pay

## 2020-12-23 ENCOUNTER — Encounter (HOSPITAL_COMMUNITY)
Admission: RE | Admit: 2020-12-23 | Discharge: 2020-12-23 | Disposition: A | Discharge status: Home / Self Care | Payer: BC Managed Care – PPO | Source: Ambulatory Visit | Attending: Gynecologic Oncology | Admitting: Gynecologic Oncology

## 2020-12-23 ENCOUNTER — Encounter (HOSPITAL_COMMUNITY): Payer: Self-pay

## 2020-12-23 DIAGNOSIS — Z01818 Encounter for other preprocedural examination: Secondary | ICD-10-CM | POA: Insufficient documentation

## 2020-12-23 LAB — COMPREHENSIVE METABOLIC PANEL
ALT: 82 U/L — ABNORMAL HIGH (ref 0–44)
AST: 60 U/L — ABNORMAL HIGH (ref 15–41)
Albumin: 3.8 g/dL (ref 3.5–5.0)
Alkaline Phosphatase: 63 U/L (ref 38–126)
Anion gap: 13 (ref 5–15)
BUN: 13 mg/dL (ref 6–20)
CO2: 26 mmol/L (ref 22–32)
Calcium: 9.7 mg/dL (ref 8.9–10.3)
Chloride: 99 mmol/L (ref 98–111)
Creatinine, Ser: 0.76 mg/dL (ref 0.44–1.00)
GFR, Estimated: >60 mL/min (ref >60)
Glucose, Bld: 125 mg/dL — ABNORMAL HIGH (ref 70–99)
Potassium: 3.5 mmol/L (ref 3.5–5.1)
Sodium: 138 mmol/L (ref 135–145)
Total Bilirubin: 0.6 mg/dL (ref 0.3–1.2)
Total Protein: 7.4 g/dL (ref 6.5–8.1)

## 2020-12-23 LAB — CBC
HCT: 43.3 % (ref 36.0–46.0)
Hemoglobin: 14.5 g/dL (ref 12.0–15.0)
MCH: 30.1 pg (ref 26.0–34.0)
MCHC: 33.5 g/dL (ref 30.0–36.0)
MCV: 90 fL (ref 80.0–100.0)
Platelets: 254 10*3/uL (ref 150–400)
RBC: 4.81 MIL/uL (ref 3.87–5.11)
RDW: 12.4 % (ref 11.5–15.5)
WBC: 6.6 10*3/uL (ref 4.0–10.5)
nRBC: 0 % (ref 0.0–0.2)

## 2020-12-23 LAB — URINALYSIS, ROUTINE W REFLEX MICROSCOPIC
Bilirubin Urine: NEGATIVE
Glucose, UA: NEGATIVE mg/dL
Hgb urine dipstick: NEGATIVE
Ketones, ur: NEGATIVE mg/dL
Leukocytes,Ua: NEGATIVE
Nitrite: NEGATIVE
Protein, ur: NEGATIVE mg/dL
Specific Gravity, Urine: 1.01 (ref 1.005–1.030)
pH: 6 (ref 5.0–8.0)

## 2020-12-23 LAB — TYPE AND SCREEN
ABO/RH(D): O POS
Antibody Screen: NEGATIVE

## 2020-12-23 NOTE — Telephone Encounter (Signed)
Called patient to follow up on mychart message. Requested letter will be emailed to the patient securely. All questions answered in relation to her upcoming surgery. Advised to call for any additional needs or questions.

## 2020-12-23 NOTE — Progress Notes (Addendum)
Anesthesia Review:  PCP: Grier Mitts LOV 03/2020  Cardiologist : none  Chest x-ray : EKG :12/23/20 Echo : Stress test: Cardiac Cath :  Activity level: can do a flight of stairs without difficulty  Sleep Study/ CPAP : none  Fasting Blood Sugar :      / Checks Blood Sugar -- times a day:   Blood Thinner/ Instructions /Last Dose: ASA / Instructions/ Last Dose :   No covid test- ambulatory surgery

## 2020-12-24 ENCOUNTER — Telehealth: Payer: Self-pay

## 2020-12-24 NOTE — Telephone Encounter (Signed)
Left message requesting return call to discuss lab results from 12/23/20.

## 2020-12-24 NOTE — Telephone Encounter (Signed)
Spoke with Dorothy Taylor regarding her elevated AST and ALT. Patient denies taking any tylenol but did report having alcohol the night prior to her lab appointment. Instructed to avoid alcohol and tylenol. Lab results have been sent to her PCP Dorothy Taylor, patient to follow up with her. Patient verbalized understanding. Instructed to call with any questions or concerns.

## 2021-01-04 ENCOUNTER — Telehealth: Payer: Self-pay

## 2021-01-04 NOTE — Telephone Encounter (Signed)
Dorothy Taylor states that she understands her pre-op instructions  as noted on 12-23-20 by Gillian Shields RN.  No questions or concerns noted at this time.

## 2021-01-05 ENCOUNTER — Encounter (HOSPITAL_COMMUNITY)
Admission: RE | Disposition: A | Discharge status: Home / Self Care | Payer: Self-pay | Source: Home / Self Care | Attending: Gynecologic Oncology

## 2021-01-05 ENCOUNTER — Ambulatory Visit (HOSPITAL_COMMUNITY): Payer: BC Managed Care – PPO | Admitting: Anesthesiology

## 2021-01-05 ENCOUNTER — Ambulatory Visit (HOSPITAL_COMMUNITY): Payer: BC Managed Care – PPO | Admitting: Physician Assistant

## 2021-01-05 ENCOUNTER — Ambulatory Visit (HOSPITAL_COMMUNITY)
Admission: RE | Admit: 2021-01-05 | Discharge: 2021-01-05 | Disposition: A | Discharge status: Home / Self Care | Payer: BC Managed Care – PPO | Attending: Gynecologic Oncology | Admitting: Gynecologic Oncology

## 2021-01-05 ENCOUNTER — Encounter (HOSPITAL_COMMUNITY): Payer: Self-pay | Admitting: Gynecologic Oncology

## 2021-01-05 DIAGNOSIS — N802 Endometriosis of fallopian tube: Secondary | ICD-10-CM | POA: Insufficient documentation

## 2021-01-05 DIAGNOSIS — Z8349 Family history of other endocrine, nutritional and metabolic diseases: Secondary | ICD-10-CM | POA: Insufficient documentation

## 2021-01-05 DIAGNOSIS — F32A Depression, unspecified: Secondary | ICD-10-CM | POA: Insufficient documentation

## 2021-01-05 DIAGNOSIS — I10 Essential (primary) hypertension: Secondary | ICD-10-CM | POA: Insufficient documentation

## 2021-01-05 DIAGNOSIS — E785 Hyperlipidemia, unspecified: Secondary | ICD-10-CM | POA: Insufficient documentation

## 2021-01-05 DIAGNOSIS — E669 Obesity, unspecified: Secondary | ICD-10-CM | POA: Insufficient documentation

## 2021-01-05 DIAGNOSIS — Z8249 Family history of ischemic heart disease and other diseases of the circulatory system: Secondary | ICD-10-CM | POA: Insufficient documentation

## 2021-01-05 DIAGNOSIS — Z6841 Body Mass Index (BMI) 40.0 and over, adult: Secondary | ICD-10-CM | POA: Insufficient documentation

## 2021-01-05 DIAGNOSIS — N838 Other noninflammatory disorders of ovary, fallopian tube and broad ligament: Secondary | ICD-10-CM | POA: Insufficient documentation

## 2021-01-05 DIAGNOSIS — D251 Intramural leiomyoma of uterus: Secondary | ICD-10-CM

## 2021-01-05 DIAGNOSIS — Z79899 Other long term (current) drug therapy: Secondary | ICD-10-CM | POA: Insufficient documentation

## 2021-01-05 HISTORY — PX: ROBOTIC ASSISTED LAPAROSCOPIC HYSTERECTOMY AND SALPINGECTOMY: SHX6379

## 2021-01-05 LAB — HCG, SERUM, QUALITATIVE: Preg, Serum: NEGATIVE

## 2021-01-05 LAB — ABO/RH: ABO/RH(D): O POS

## 2021-01-05 LAB — PROTIME-INR
INR: 0.9 (ref 0.8–1.2)
Prothrombin Time: 12.1 seconds (ref 11.4–15.2)

## 2021-01-05 LAB — PREGNANCY, URINE: Preg Test, Ur: NEGATIVE

## 2021-01-05 LAB — APTT: aPTT: 28 seconds (ref 24–36)

## 2021-01-05 SURGERY — XI ROBOTIC ASSISTED LAPAROSCOPIC HYSTERECTOMY AND SALPINGECTOMY
Anesthesia: General | Laterality: Bilateral

## 2021-01-05 MED ORDER — OXYCODONE HCL 5 MG PO TABS
ORAL_TABLET | ORAL | Status: AC
  Administered 2021-01-05: 5 mg via ORAL
  Filled 2021-01-05: qty 1

## 2021-01-05 MED ORDER — OXYCODONE HCL 5 MG/5ML PO SOLN: 5.0000 mg | Freq: Once | ORAL | Status: AC | PRN

## 2021-01-05 MED ORDER — MEPERIDINE HCL 50 MG/ML IJ SOLN: 6.2500 mg | INTRAMUSCULAR | Status: DC | PRN

## 2021-01-05 MED ORDER — POVIDONE-IODINE 10 % EX SWAB
2.0000 "application " | Freq: Once | CUTANEOUS | Status: AC
  Administered 2021-01-05: 2 via TOPICAL

## 2021-01-05 MED ORDER — KETAMINE HCL 10 MG/ML IJ SOLN
INTRAMUSCULAR | Status: AC
  Filled 2021-01-05: qty 1

## 2021-01-05 MED ORDER — PROPOFOL 10 MG/ML IV BOLUS
INTRAVENOUS | Status: DC | PRN
  Administered 2021-01-05: 200 mg via INTRAVENOUS

## 2021-01-05 MED ORDER — ROCURONIUM BROMIDE 10 MG/ML (PF) SYRINGE
PREFILLED_SYRINGE | INTRAVENOUS | Status: AC
  Filled 2021-01-05: qty 10

## 2021-01-05 MED ORDER — OXYCODONE HCL 5 MG PO TABS: 5.0000 mg | ORAL_TABLET | Freq: Once | ORAL | Status: AC | PRN

## 2021-01-05 MED ORDER — SUGAMMADEX SODIUM 500 MG/5ML IV SOLN
INTRAVENOUS | Status: DC | PRN
  Administered 2021-01-05: 300 mg via INTRAVENOUS

## 2021-01-05 MED ORDER — FENTANYL CITRATE (PF) 100 MCG/2ML IJ SOLN
INTRAMUSCULAR | Status: DC | PRN
  Administered 2021-01-05: 100 ug via INTRAVENOUS
  Administered 2021-01-05 (×3): 50 ug via INTRAVENOUS

## 2021-01-05 MED ORDER — ONDANSETRON HCL 4 MG/2ML IJ SOLN
INTRAMUSCULAR | Status: DC | PRN
  Administered 2021-01-05: 4 mg via INTRAVENOUS

## 2021-01-05 MED ORDER — FENTANYL CITRATE (PF) 100 MCG/2ML IJ SOLN
INTRAMUSCULAR | Status: AC
  Filled 2021-01-05: qty 2

## 2021-01-05 MED ORDER — SUGAMMADEX SODIUM 500 MG/5ML IV SOLN
INTRAVENOUS | Status: AC
  Filled 2021-01-05: qty 5

## 2021-01-05 MED ORDER — KETOROLAC TROMETHAMINE 15 MG/ML IJ SOLN
15.0000 mg | INTRAMUSCULAR | Status: AC
  Administered 2021-01-05: 15 mg via INTRAVENOUS
  Filled 2021-01-05: qty 1

## 2021-01-05 MED ORDER — CEFAZOLIN SODIUM-DEXTROSE 2-4 GM/100ML-% IV SOLN
2.0000 g | INTRAVENOUS | Status: AC
  Administered 2021-01-05: 2 g via INTRAVENOUS
  Filled 2021-01-05: qty 100

## 2021-01-05 MED ORDER — MIDAZOLAM HCL 2 MG/2ML IJ SOLN: 0.5000 mg | Freq: Once | INTRAMUSCULAR | Status: DC | PRN

## 2021-01-05 MED ORDER — ROCURONIUM BROMIDE 10 MG/ML (PF) SYRINGE
PREFILLED_SYRINGE | INTRAVENOUS | Status: DC | PRN
  Administered 2021-01-05: 70 mg via INTRAVENOUS

## 2021-01-05 MED ORDER — PROPOFOL 10 MG/ML IV BOLUS
INTRAVENOUS | Status: AC
  Filled 2021-01-05: qty 20

## 2021-01-05 MED ORDER — EPHEDRINE 5 MG/ML INJ
INTRAVENOUS | Status: AC
  Filled 2021-01-05: qty 5

## 2021-01-05 MED ORDER — PROMETHAZINE HCL 25 MG/ML IJ SOLN: 6.2500 mg | INTRAMUSCULAR | Status: DC | PRN

## 2021-01-05 MED ORDER — LACTATED RINGERS IV SOLN: INTRAVENOUS | Status: DC

## 2021-01-05 MED ORDER — DEXAMETHASONE SODIUM PHOSPHATE 10 MG/ML IJ SOLN
INTRAMUSCULAR | Status: DC | PRN
  Administered 2021-01-05: 8 mg via INTRAVENOUS

## 2021-01-05 MED ORDER — LIDOCAINE HCL 2 % IJ SOLN
INTRAMUSCULAR | Status: AC
  Filled 2021-01-05: qty 20

## 2021-01-05 MED ORDER — GABAPENTIN 300 MG PO CAPS
300.0000 mg | ORAL_CAPSULE | ORAL | Status: AC
  Administered 2021-01-05: 300 mg via ORAL
  Filled 2021-01-05: qty 1

## 2021-01-05 MED ORDER — LIDOCAINE HCL (PF) 2 % IJ SOLN
INTRAMUSCULAR | Status: AC
  Filled 2021-01-05: qty 5

## 2021-01-05 MED ORDER — ONDANSETRON HCL 4 MG/2ML IJ SOLN
INTRAMUSCULAR | Status: AC
  Filled 2021-01-05: qty 2

## 2021-01-05 MED ORDER — SCOPOLAMINE 1 MG/3DAYS TD PT72
1.0000 | MEDICATED_PATCH | TRANSDERMAL | Status: DC
  Administered 2021-01-05: 1.5 mg via TRANSDERMAL
  Filled 2021-01-05: qty 1

## 2021-01-05 MED ORDER — SODIUM CHLORIDE 0.9% FLUSH: 3.0000 mL | Freq: Two times a day (BID) | INTRAVENOUS | Status: DC

## 2021-01-05 MED ORDER — DEXAMETHASONE SODIUM PHOSPHATE 10 MG/ML IJ SOLN
INTRAMUSCULAR | Status: AC
  Filled 2021-01-05: qty 1

## 2021-01-05 MED ORDER — STERILE WATER FOR IRRIGATION IR SOLN
Status: DC | PRN
  Administered 2021-01-05: 1000 mL

## 2021-01-05 MED ORDER — KETAMINE HCL 10 MG/ML IJ SOLN
INTRAMUSCULAR | Status: DC | PRN
  Administered 2021-01-05 (×2): 15 mg via INTRAVENOUS

## 2021-01-05 MED ORDER — GLYCOPYRROLATE PF 0.2 MG/ML IJ SOSY
PREFILLED_SYRINGE | INTRAMUSCULAR | Status: DC | PRN
  Administered 2021-01-05: .3 mg via INTRAVENOUS

## 2021-01-05 MED ORDER — MIDAZOLAM HCL 5 MG/5ML IJ SOLN
INTRAMUSCULAR | Status: DC | PRN
  Administered 2021-01-05: 2 mg via INTRAVENOUS

## 2021-01-05 MED ORDER — LACTATED RINGERS IR SOLN
Status: DC | PRN
  Administered 2021-01-05: 1000 mL

## 2021-01-05 MED ORDER — BUPIVACAINE LIPOSOME 1.3 % IJ SUSP
INTRAMUSCULAR | Status: AC
  Filled 2021-01-05: qty 20

## 2021-01-05 MED ORDER — BUPIVACAINE HCL 0.25 % IJ SOLN
INTRAMUSCULAR | Status: DC | PRN
  Administered 2021-01-05: 20 mL

## 2021-01-05 MED ORDER — PHENYLEPHRINE HCL (PRESSORS) 10 MG/ML IV SOLN
INTRAVENOUS | Status: AC
  Filled 2021-01-05: qty 2

## 2021-01-05 MED ORDER — HYDROMORPHONE HCL 1 MG/ML IJ SOLN
INTRAMUSCULAR | Status: AC
  Administered 2021-01-05: 0.25 mg via INTRAVENOUS
  Filled 2021-01-05: qty 1

## 2021-01-05 MED ORDER — GLYCOPYRROLATE 0.2 MG/ML IJ SOLN
INTRAMUSCULAR | Status: AC
  Filled 2021-01-05: qty 1

## 2021-01-05 MED ORDER — DEXAMETHASONE SODIUM PHOSPHATE 4 MG/ML IJ SOLN: 4.0000 mg | INTRAMUSCULAR | Status: DC

## 2021-01-05 MED ORDER — HYDROMORPHONE HCL 1 MG/ML IJ SOLN: 0.2500 mg | INTRAMUSCULAR | Status: DC | PRN

## 2021-01-05 MED ORDER — ACETAMINOPHEN 500 MG PO TABS
1000.0000 mg | ORAL_TABLET | ORAL | Status: AC
  Administered 2021-01-05: 1000 mg via ORAL
  Filled 2021-01-05: qty 2

## 2021-01-05 MED ORDER — LACTATED RINGERS IV SOLN
INTRAVENOUS | Status: DC | PRN
Start: 2021-01-05 — End: 2021-01-05

## 2021-01-05 MED ORDER — NEOSTIGMINE METHYLSULFATE 3 MG/3ML IV SOSY
PREFILLED_SYRINGE | INTRAVENOUS | Status: AC
  Filled 2021-01-05: qty 3

## 2021-01-05 MED ORDER — BUPIVACAINE LIPOSOME 1.3 % IJ SUSP
20.0000 mL | Freq: Once | INTRAMUSCULAR | Status: AC
  Administered 2021-01-05: 20 mL

## 2021-01-05 MED ORDER — EPHEDRINE SULFATE-NACL 50-0.9 MG/10ML-% IV SOSY
PREFILLED_SYRINGE | INTRAVENOUS | Status: DC | PRN
  Administered 2021-01-05: 5 mg via INTRAVENOUS

## 2021-01-05 MED ORDER — MIDAZOLAM HCL 2 MG/2ML IJ SOLN
INTRAMUSCULAR | Status: AC
  Filled 2021-01-05: qty 2

## 2021-01-05 MED ORDER — LIDOCAINE 2% (20 MG/ML) 5 ML SYRINGE
INTRAMUSCULAR | Status: DC | PRN
  Administered 2021-01-05: 1.5 mg/kg/h via INTRAVENOUS
  Administered 2021-01-05: 20 mg via INTRAVENOUS

## 2021-01-05 SURGICAL SUPPLY — 72 items
APPLICATOR SURGIFLO ENDO (HEMOSTASIS) IMPLANT
BAG COUNTER SPONGE SURGICOUNT (BAG) ×2 IMPLANT
BAG LAPAROSCOPIC 12 15 PORT 16 (BASKET) IMPLANT
BAG RETRIEVAL 12/15 (BASKET)
BLADE SURG SZ10 CARB STEEL (BLADE) ×2 IMPLANT
CELLS DAT CNTRL 66122 CELL SVR (MISCELLANEOUS) IMPLANT
COVER BACK TABLE 60X90IN (DRAPES) ×2 IMPLANT
COVER TIP SHEARS 8 DVNC (MISCELLANEOUS) ×1 IMPLANT
COVER TIP SHEARS 8MM DA VINCI (MISCELLANEOUS) ×1
DECANTER SPIKE VIAL GLASS SM (MISCELLANEOUS) ×2 IMPLANT
DERMABOND ADVANCED (GAUZE/BANDAGES/DRESSINGS) ×2
DERMABOND ADVANCED .7 DNX12 (GAUZE/BANDAGES/DRESSINGS) ×2 IMPLANT
DRAPE ARM DVNC X/XI (DISPOSABLE) ×4 IMPLANT
DRAPE COLUMN DVNC XI (DISPOSABLE) ×1 IMPLANT
DRAPE DA VINCI XI ARM (DISPOSABLE) ×4
DRAPE DA VINCI XI COLUMN (DISPOSABLE) ×1
DRAPE SHEET LG 3/4 BI-LAMINATE (DRAPES) ×2 IMPLANT
DRAPE SURG IRRIG POUCH 19X23 (DRAPES) ×2 IMPLANT
DRSG OPSITE POSTOP 4X6 (GAUZE/BANDAGES/DRESSINGS) ×2 IMPLANT
DRSG OPSITE POSTOP 4X8 (GAUZE/BANDAGES/DRESSINGS) IMPLANT
ELECT PENCIL ROCKER SW 15FT (MISCELLANEOUS) ×2 IMPLANT
ELECT REM PT RETURN 15FT ADLT (MISCELLANEOUS) ×2 IMPLANT
GAUZE 4X4 16PLY ~~LOC~~+RFID DBL (SPONGE) ×4 IMPLANT
GLOVE SURG ENC MOIS LTX SZ6 (GLOVE) ×8 IMPLANT
GLOVE SURG ENC MOIS LTX SZ6.5 (GLOVE) ×4 IMPLANT
GOWN STRL REUS W/ TWL LRG LVL3 (GOWN DISPOSABLE) ×4 IMPLANT
GOWN STRL REUS W/TWL LRG LVL3 (GOWN DISPOSABLE) ×4
HOLDER FOLEY CATH W/STRAP (MISCELLANEOUS) IMPLANT
IRRIG SUCT STRYKERFLOW 2 WTIP (MISCELLANEOUS) ×4
IRRIGATION SUCT STRKRFLW 2 WTP (MISCELLANEOUS) ×2 IMPLANT
KIT TURNOVER KIT A (KITS) ×2 IMPLANT
MANIPULATOR ADVINCU DEL 3.0 PL (MISCELLANEOUS) ×2 IMPLANT
MANIPULATOR UTERINE 4.5 ZUMI (MISCELLANEOUS) IMPLANT
NEEDLE HYPO 22GX1.5 SAFETY (NEEDLE) ×4 IMPLANT
OBTURATOR OPTICAL STANDARD 8MM (TROCAR) ×1
OBTURATOR OPTICAL STND 8 DVNC (TROCAR) ×1
OBTURATOR OPTICALSTD 8 DVNC (TROCAR) ×1 IMPLANT
PACK ROBOT GYN CUSTOM WL (TRAY / TRAY PROCEDURE) ×2 IMPLANT
PAD POSITIONING PINK XL (MISCELLANEOUS) ×2 IMPLANT
PORT ACCESS TROCAR AIRSEAL 12 (TROCAR) ×1 IMPLANT
PORT ACCESS TROCAR AIRSEAL 5M (TROCAR) ×1
POUCH SPECIMEN RETRIEVAL 10MM (ENDOMECHANICALS) IMPLANT
RETRACTOR WND ALEXIS 25 LRG (MISCELLANEOUS) ×1 IMPLANT
RTRCTR WOUND ALEXIS 18CM MED (MISCELLANEOUS)
RTRCTR WOUND ALEXIS 25CM LRG (MISCELLANEOUS) ×2
SCRUB EXIDINE 4% CHG 4OZ (MISCELLANEOUS) ×2 IMPLANT
SEAL CANN UNIV 5-8 DVNC XI (MISCELLANEOUS) ×4 IMPLANT
SEAL XI 5MM-8MM UNIVERSAL (MISCELLANEOUS) ×4
SET TRI-LUMEN FLTR TB AIRSEAL (TUBING) ×2 IMPLANT
SPONGE T-LAP 18X18 ~~LOC~~+RFID (SPONGE) ×2 IMPLANT
SURGIFLO W/THROMBIN 8M KIT (HEMOSTASIS) IMPLANT
SUT MNCRL AB 4-0 PS2 18 (SUTURE) ×2 IMPLANT
SUT PDS AB 1 TP1 96 (SUTURE) ×4 IMPLANT
SUT VIC AB 0 CT1 27 (SUTURE) ×1
SUT VIC AB 0 CT1 27XBRD ANTBC (SUTURE) ×1 IMPLANT
SUT VIC AB 2-0 CT1 27 (SUTURE) ×1
SUT VIC AB 2-0 CT1 TAPERPNT 27 (SUTURE) ×1 IMPLANT
SUT VIC AB 3-0 SH 27 (SUTURE) ×1
SUT VIC AB 3-0 SH 27X BRD (SUTURE) ×1 IMPLANT
SUT VIC AB 4-0 PS2 18 (SUTURE) ×6 IMPLANT
SUT VLOC 180 0 9IN  GS21 (SUTURE) ×1
SUT VLOC 180 0 9IN GS21 (SUTURE) ×1 IMPLANT
SYR 10ML LL (SYRINGE) IMPLANT
SYR 20ML LL LF (SYRINGE) ×4 IMPLANT
SYR 50ML LL SCALE MARK (SYRINGE) IMPLANT
TOWEL OR NON WOVEN STRL DISP B (DISPOSABLE) ×2 IMPLANT
TRAP SPECIMEN MUCUS 40CC (MISCELLANEOUS) IMPLANT
TRAY FOLEY MTR SLVR 16FR STAT (SET/KITS/TRAYS/PACK) ×2 IMPLANT
TROCAR XCEL NON-BLD 5MMX100MML (ENDOMECHANICALS) ×2 IMPLANT
UNDERPAD 30X36 HEAVY ABSORB (UNDERPADS AND DIAPERS) ×2 IMPLANT
WATER STERILE IRR 1000ML POUR (IV SOLUTION) ×2 IMPLANT
YANKAUER SUCT BULB TIP 10FT TU (MISCELLANEOUS) ×2 IMPLANT

## 2021-01-05 NOTE — Anesthesia Procedure Notes (Signed)
Procedure Name: Intubation Date/Time: 01/05/2021 7:37 AM Performed by: Victoriano Lain, CRNA Pre-anesthesia Checklist: Patient identified, Emergency Drugs available, Suction available, Patient being monitored and Timeout performed Patient Re-evaluated:Patient Re-evaluated prior to induction Oxygen Delivery Method: Circle system utilized Preoxygenation: Pre-oxygenation with 100% oxygen Induction Type: IV induction Ventilation: Mask ventilation without difficulty Laryngoscope Size: Mac and 4 Grade View: Grade I Tube type: Oral Tube size: 7.5 mm Number of attempts: 1 Airway Equipment and Method: Stylet Placement Confirmation: ETT inserted through vocal cords under direct vision, positive ETCO2 and breath sounds checked- equal and bilateral Secured at: 21 cm Tube secured with: Tape Dental Injury: Teeth and Oropharynx as per pre-operative assessment

## 2021-01-05 NOTE — CV Procedure (Signed)
OPERATIVE NOTE 01/05/21  Surgeon: Donaciano Eva   Assistants: Dr Lahoma Crocker (an MD assistant was necessary for tissue manipulation, management of robotic instrumentation, retraction and positioning due to the complexity of the case and [facility] policies).   Anesthesia: General endotracheal anesthesia  ASA Class: 3   Pre-operative Diagnosis: fibroids  Post-operative Diagnosis:  same  Operation: Robotic-assisted laparoscopic total hysterectomy >250gm with bilateral salpingectomy   Surgeon: Donaciano Eva  Assistant Surgeon: Lahoma Crocker MD  Anesthesia: GET  Urine Output: 50cc  Operative Findings:  : 20cm fibroid uterus, normal appearing tubes and ovaries. Normal appendix.  Estimated Blood Loss:  less than 100 mL      Total IV Fluids: 1,000 ml         Specimens:  uterus with tubes and cervix [id number]         Complications:  None; patient tolerated the procedure well.         Disposition: PACU - hemodynamically stable.  Procedure Details  The patient was seen in the Holding Room. The risks, benefits, complications, treatment options, and expected outcomes were discussed with the patient.  The patient concurred with the proposed plan, giving informed consent.  The site of surgery properly noted/marked. The patient was identified as Dorothy Taylor and the procedure verified as a Robotic-assisted hysterectomy with bilateral salpingo oophorectomy. A Time Out was held and the above information confirmed.  After induction of anesthesia, the patient was draped and prepped in the usual sterile manner. Pt was placed in supine position after anesthesia and draped and prepped in the usual sterile manner. The abdominal drape was placed after the CholoraPrep had been allowed to dry for 3 minutes.  Her arms were tucked to her side with all appropriate precautions.  The shoulders were stabilized with padded shoulder blocks applied to the acromium processes.  The  patient was placed in the semi-lithotomy position in [place].  The perineum was prepped with Betadine. The patient was then prepped. Foley catheter was placed.  A sterile speculum was placed in the vagina.  The cervix was grasped with a single-tooth tenaculum and dilated with Kennon Rounds dilators.  The ZUMI uterine manipulator with a medium colpotomizer ring was placed without difficulty.  A pneum occluder balloon was placed over the manipulator.  OG tube placement was confirmed and to suction.   Next, a 5 mm skin incision was made 1 cm below the subcostal margin in the midclavicular line.  The 5 mm Optiview port and scope was used for direct entry.  Opening pressure was under 10 mm CO2.  The abdomen was insufflated and the findings were noted as above.   At this point and all points during the procedure, the patient's intra-abdominal pressure did not exceed 15 mmHg. Next, a 10 mm skin incision was made 5cm above the umbilicus and a right and left port was placed about 10 cm lateral to the robot port on the right and left side.  A fourth arm was placed in the mid left abdomen.  All ports were placed under direct visualization.  The patient was placed in steep Trendelenburg.  Bowel was folded away into the upper abdomen.  The robot was docked in the normal manner.  The hysterectomy was started after the round ligament on the right side was incised and the retroperitoneum was entered and the pararectal space was developed.  The ureter was noted to be on the medial leaf of the broad ligament.  The peritoneum above the ureter  was incised and stretched and the utero-ovarian ligament was skeletonized, cauterized and cut. The fallopian tube was separated from the ovary. The posterior peritoneum was taken down to the level of the KOH ring.  The anterior peritoneum was also taken down.  The bladder flap was created to the level of the KOH ring.  The uterine artery on the right side was skeletonized, cauterized and cut  in the normal manner.  A similar procedure was performed on the left.  The colpotomy was made and the uterus, cervix, bilateral tubes were amputated.  Pedicles were inspected and excellent hemostasis was achieved.    The colpotomy at the vaginal cuff was closed with Vicryl on a CT1 needle in figure of 8's and v-loc in a running manner.  Irrigation was used and excellent hemostasis was achieved.  At this point in the procedure was completed.  Robotic instruments were removed under direct visulaization.  The robot was undocked.   A 6cm supraumbilical vertical incision was made with the scalpel incorporating the midline portsite. The subcutaneous skin was opened with the bovie. The fascia was opened with the bovie vertically and the peritoneum was opened sharply in the midline. The peritoneal incision was extended. The uterine specimen in was morcellated in a single strip/piece through the incision and weighed (980gm). The fascia was closed with running looped #1PDS. The subcutaneous fat was closed with 2-0 vicryl. 20cc of exparel mixed with 20cc of marcaine was infiltrated into the incision. The incision was closed at the skin with monocryl and dermabond.   The 10 mm ports were closed with Vicryl on a UR-5 needle and the fascia was closed with 0 Vicryl on a UR-5 needle.  The skin was closed with 4-0 Vicryl in a subcuticular manner.  Dermabond was applied.  Sponge, lap and needle counts correct x 2.  The patient was taken to the recovery room in stable condition.  The vagina was swabbed with  minimal bleeding noted.   All instrument and needle counts were correct x  3.   The patient was transferred to the recovery room in a stable condition.  Donaciano Eva, MD

## 2021-01-05 NOTE — Anesthesia Preprocedure Evaluation (Signed)
Anesthesia Evaluation  Patient identified by MRN, date of birth, ID band Patient awake    Reviewed: Allergy & Precautions, NPO status , Patient's Chart, lab work & pertinent test results  History of Anesthesia Complications Negative for: history of anesthetic complications  Airway Mallampati: II  TM Distance: >3 FB Neck ROM: Full    Dental  (+) Dental Advisory Given   Pulmonary neg pulmonary ROS,    breath sounds clear to auscultation       Cardiovascular hypertension, Pt. on medications (-) angina Rhythm:Regular Rate:Normal     Neuro/Psych Depression negative neurological ROS     GI/Hepatic negative GI ROS, Neg liver ROS,   Endo/Other  Morbid obesity  Renal/GU negative Renal ROS     Musculoskeletal   Abdominal (+) + obese,   Peds  Hematology negative hematology ROS (+)   Anesthesia Other Findings   Reproductive/Obstetrics                             Anesthesia Physical Anesthesia Plan  ASA: 2  Anesthesia Plan: General   Post-op Pain Management:    Induction: Intravenous  PONV Risk Score and Plan: 3 and Ondansetron, Dexamethasone and Scopolamine patch - Pre-op  Airway Management Planned: Oral ETT  Additional Equipment: None  Intra-op Plan:   Post-operative Plan: Extubation in OR  Informed Consent: I have reviewed the patients History and Physical, chart, labs and discussed the procedure including the risks, benefits and alternatives for the proposed anesthesia with the patient or authorized representative who has indicated his/her understanding and acceptance.     Dental advisory given  Plan Discussed with: CRNA and Surgeon  Anesthesia Plan Comments:         Anesthesia Quick Evaluation

## 2021-01-05 NOTE — Discharge Instructions (Addendum)
Return to work: 4 weeks (2 weeks with physical restrictions).  Activity: 1. Be up and out of the bed during the day.  Take a nap if needed.  You may walk up steps but be careful and use the hand rail.  Stair climbing will tire you more than you think, you may need to stop part way and rest.   2. No lifting or straining for 4 weeks.  3. No driving for 1 weeks.  Do Not drive if you are taking narcotic pain medicine.  4. Shower daily.  Use soap and water on your incision and pat dry; don't rub.   5. No sexual activity and nothing in the vagina for 8 weeks.  Medications:  - Take ibuprofen and tylenol first line for pain control. Take these regularly (every 6 hours) to decrease the build up of pain.  - If necessary, for severe pain not relieved by ibuprofen, contact Dr Serita Grit office and you will be prescribed percocet.  - While taking percocet you should take sennakot every night to reduce the likelihood of constipation. If this causes diarrhea, stop its use.  Diet: 1. Low sodium Heart Healthy Diet is recommended.  2. It is safe to use a laxative if you have difficulty moving your bowels.   Wound Care: 1. Keep clean and dry.  Shower daily.  Reasons to call the Doctor:  Fever - Oral temperature greater than 100.4 degrees Fahrenheit Foul-smelling vaginal discharge Difficulty urinating Nausea and vomiting Increased pain at the site of the incision that is unrelieved with pain medicine. Difficulty breathing with or without chest pain New calf pain especially if only on one side Sudden, continuing increased vaginal bleeding with or without clots.   Follow-up: 1. See Everitt Amber in 4 weeks.  Contacts: For questions or concerns you should contact:  Dr. Everitt Amber at [phone] After hours and on week-ends call [phone] and ask to speak to the physician on call for Gynecologic Oncology

## 2021-01-05 NOTE — Anesthesia Postprocedure Evaluation (Signed)
Anesthesia Post Note  Patient: Dorothy Taylor  Procedure(s) Performed: XI ROBOTIC ASSISTED LAPAROSCOPIC HYSTERECTOMY GREATER THAN TWO HUNDRED AND FIFTY GRAMS AND SALPINGECTOMY WITH MINI LAPAROTOMY (Bilateral)     Patient location during evaluation: PACU Anesthesia Type: General Level of consciousness: awake and alert, patient cooperative and oriented Pain management: pain level controlled Vital Signs Assessment: post-procedure vital signs reviewed and stable Respiratory status: spontaneous breathing, nonlabored ventilation and respiratory function stable Cardiovascular status: blood pressure returned to baseline and stable Postop Assessment: no apparent nausea or vomiting Anesthetic complications: no   No notable events documented.  Last Vitals:  Vitals:   01/05/21 1030 01/05/21 1045  BP: (!) 146/79   Pulse: 75   Resp: 15   Temp:    SpO2: 90% 98%    Last Pain:  Vitals:   01/05/21 1045  TempSrc:   PainSc: 5                  Preciosa Bundrick,E. Lenoria Narine

## 2021-01-05 NOTE — Interval H&P Note (Signed)
History and Physical Interval Note:  01/05/2021 7:20 AM  Dorothy Taylor  has presented today for surgery, with the diagnosis of UTERINE FIBROIDS.  The various methods of treatment have been discussed with the patient and family. After consideration of risks, benefits and other options for treatment, the patient has consented to  Procedure(s): XI ROBOTIC ASSISTED LAPAROSCOPIC HYSTERECTOMY GREATER THAN TWO HUNDRED AND FIFTY GRAMS AND SALPINGECTOMY WITH MINI LAPAROTOMY (Bilateral) as a surgical intervention.  The patient's history has been reviewed, patient examined, no change in status, stable for surgery.  I have reviewed the patient's chart and labs.  Questions were answered to the patient's satisfaction.     Thereasa Solo

## 2021-01-05 NOTE — Transfer of Care (Signed)
Immediate Anesthesia Transfer of Care Note  Patient: Dorothy Taylor  Procedure(s) Performed: XI ROBOTIC ASSISTED LAPAROSCOPIC HYSTERECTOMY GREATER THAN TWO HUNDRED AND FIFTY GRAMS AND SALPINGECTOMY WITH MINI LAPAROTOMY (Bilateral)  Patient Location: PACU  Anesthesia Type:General  Level of Consciousness: awake, alert , oriented and patient cooperative  Airway & Oxygen Therapy: Patient Spontanous Breathing and Patient connected to face mask oxygen  Post-op Assessment: Report given to RN, Post -op Vital signs reviewed and stable and Patient moving all extremities  Post vital signs: Reviewed and stable  Last Vitals:  Vitals Value Taken Time  BP 151/90 01/05/21 1015  Temp    Pulse 86 01/05/21 1015  Resp 15 01/05/21 1015  SpO2 100 % 01/05/21 1015  Vitals shown include unvalidated device data.  Last Pain:  Vitals:   01/05/21 0623  TempSrc: Oral         Complications: No notable events documented.

## 2021-01-05 NOTE — Addendum Note (Signed)
Addendum  created 01/05/21 1137 by Victoriano Lain, CRNA   Flowsheet accepted

## 2021-01-06 ENCOUNTER — Telehealth: Payer: Self-pay

## 2021-01-06 ENCOUNTER — Encounter (HOSPITAL_COMMUNITY): Payer: Self-pay | Admitting: Gynecologic Oncology

## 2021-01-06 LAB — SURGICAL PATHOLOGY

## 2021-01-06 NOTE — Telephone Encounter (Signed)
Spoke with Dorothy Taylor this morning. She states she is eating, drinking and urinating well. She has not had a BM yet but is taking senokot as prescribed and encouraged her to drink plenty of water. She denies fever or chills. Incisions are dry and intact. Her pain is controlled with ibuprofen.   Instructed to call office with any fever, chills, purulent drainage, uncontrolled pain or any other questions or concerns. Patient verbalizes understanding.   Pt aware of post op appointments as well as the office number [phone] and after hours number [phone] to call if she has any questions or concerns

## 2021-01-08 ENCOUNTER — Telehealth: Payer: Self-pay

## 2021-01-08 NOTE — Telephone Encounter (Signed)
Spoke with Ms. Lapid, reviewed surgical pathology. Per Joylene John, NP no cancer seen on final path, fibroid noted. Patient verbalized understanding and is happy with news. Instructed to call with any questions or concerns.

## 2021-01-11 ENCOUNTER — Telehealth: Payer: Self-pay

## 2021-01-11 NOTE — Telephone Encounter (Signed)
Told Dorothy Taylor that the dressing can come off 5 days after her surgery which was 01-10-21.   She will remove the dressing this evening.

## 2021-01-25 ENCOUNTER — Other Ambulatory Visit: Payer: Self-pay

## 2021-01-25 ENCOUNTER — Inpatient Hospital Stay: Payer: BC Managed Care – PPO | Attending: Gynecologic Oncology | Admitting: Gynecologic Oncology

## 2021-01-25 ENCOUNTER — Encounter: Payer: Self-pay | Admitting: Gynecologic Oncology

## 2021-01-25 VITALS — BP 137/73 | HR 61 | Temp 98.6°F | Resp 16 | Ht 66.0 in | Wt 242.0 lb

## 2021-01-25 DIAGNOSIS — Z90710 Acquired absence of both cervix and uterus: Secondary | ICD-10-CM

## 2021-01-25 DIAGNOSIS — D251 Intramural leiomyoma of uterus: Secondary | ICD-10-CM

## 2021-01-25 DIAGNOSIS — Z90722 Acquired absence of ovaries, bilateral: Secondary | ICD-10-CM

## 2021-01-25 NOTE — Patient Instructions (Addendum)
You are healing well from surgery! Remember, no lifting more than 10 pounds until at least 6 weeks after surgery and nothing in the vagina for 8 weeks.  I will let your gynecologist now that you are released back to her 4-year GYN needs.

## 2021-01-25 NOTE — Progress Notes (Signed)
Gynecologic Oncology Return Clinic Visit  01/25/21  Reason for Visit: follow-up after surgery  Treatment History: THR/BS on 9/27  Interval History: Patient presents today for follow-up after recent surgery.  She notes overall doing very well.  She denies any vaginal bleeding or discharge after the first day after surgery.  She denies any significant abdominal or pelvic pain.  She reports improved bowel function from before surgery and denies any urinary symptoms.  Has some itching related to her incisions but otherwise denies any incisional pain or issues.  Past Medical/Surgical History: Past Medical History:  Diagnosis Date   Depression    Hyperlipidemia    Hypertension     Past Surgical History:  Procedure Laterality Date   NO PAST SURGERIES     ROBOTIC ASSISTED LAPAROSCOPIC HYSTERECTOMY AND SALPINGECTOMY Bilateral 01/05/2021   Procedure: XI ROBOTIC ASSISTED LAPAROSCOPIC HYSTERECTOMY GREATER THAN TWO HUNDRED AND FIFTY GRAMS AND SALPINGECTOMY WITH MINI LAPAROTOMY;  Surgeon: Everitt Amber, MD;  Location: [facility];  Service: Gynecology;  Laterality: Bilateral;    Family History  Problem Relation Age of Onset   Hypertension Mother    Hyperlipidemia Mother    Hyperlipidemia Father    Hypertension Father    Heart attack Father    Depression Father    Cancer Father    Asthma Sister    Depression Sister    Hypertension Maternal Grandmother    Hyperlipidemia Maternal Grandmother    Hypertension Maternal Grandfather    Hyperlipidemia Maternal Grandfather    Heart disease Maternal Grandfather    Heart attack Maternal Grandfather     Social History   Socioeconomic History   Marital status: Married    Spouse name: Not on file   Number of children: Not on file   Years of education: Not on file   Highest education level: Not on file  Occupational History   Not on file  Tobacco Use   Smoking status: Never   Smokeless tobacco: Never  Vaping Use   Vaping Use: Never used   Substance and Sexual Activity   Alcohol use: Yes    Comment: wine- once  - twice per week   Drug use: Never   Sexual activity: Yes  Other Topics Concern   Not on file  Social History Narrative   Not on file   Social Determinants of Health   Financial Resource Strain: Not on file  Food Insecurity: Not on file  Transportation Needs: Not on file  Physical Activity: Not on file  Stress: Not on file  Social Connections: Not on file    Current Medications:  Current Outpatient Medications:    escitalopram (LEXAPRO) 10 MG tablet, Take 1 tablet (10 mg total) by mouth daily., Disp: 90 tablet, Rfl: 3   fluticasone (FLONASE) 50 MCG/ACT nasal spray, Place 1 spray into both nostrils daily as needed for allergies. Still taking daily, Disp: , Rfl:    hydrochlorothiazide (HYDRODIURIL) 12.5 MG tablet, Take 1 tablet (12.5 mg total) by mouth daily., Disp: 90 tablet, Rfl: 3   ibuprofen (ADVIL) 800 MG tablet, Take 1 tablet (800 mg total) by mouth every 8 (eight) hours as needed for moderate pain. For AFTER surgery only, Disp: 30 tablet, Rfl: 0   Multiple Vitamins-Minerals (MULTIVITAMIN WITH MINERALS) tablet, Take 1 tablet by mouth daily., Disp: , Rfl:    PSYLLIUM HUSK PO, Take 1,400 mg by mouth daily., Disp: , Rfl:    senna-docusate (SENOKOT-S) 8.6-50 MG tablet, Take 2 tablets by mouth at bedtime. For AFTER surgery,  do not take if having diarrhea (Patient not taking: Reported on 01/22/2021), Disp: 30 tablet, Rfl: 0   traMADol (ULTRAM) 50 MG tablet, Take 1 tablet (50 mg total) by mouth every 6 (six) hours as needed for severe pain. For AFTER surgery only, do not take and drive (Patient not taking: Reported on 01/22/2021), Disp: 10 tablet, Rfl: 0  Review of Systems: Denies appetite changes, fevers, chills, fatigue, unexplained weight changes. Denies hearing loss, neck lumps or masses, mouth sores, ringing in ears or voice changes. Denies cough or wheezing.  Denies shortness of breath. Denies chest pain  or palpitations. Denies leg swelling. Denies abdominal distention, pain, blood in stools, constipation, diarrhea, nausea, vomiting, or early satiety. Denies pain with intercourse, dysuria, frequency, hematuria or incontinence. Denies hot flashes, pelvic pain, vaginal bleeding or vaginal discharge.   Denies joint pain, back pain or muscle pain/cramps. Denies itching, rash, or wounds. Denies dizziness, headaches, numbness or seizures. Denies swollen lymph nodes or glands, denies easy bruising or bleeding. Denies anxiety, depression, confusion, or decreased concentration.  Physical Exam: BP 137/73 (BP Location: Right Arm, Patient Position: Sitting)   Pulse 61   Temp 98.6 F (37 C) (Oral)   Resp 16   Ht 5\' 6"  (1.676 m)   Wt 242 lb (109.8 kg)   SpO2 99%   BMI 39.06 kg/m  General: Alert, oriented, no acute distress. HEENT: Atraumatic, normocephalic, sclera anicteric. Chest: Unlabored breathing on room air. Abdomen: Obese, soft, nontender.  Normoactive bowel sounds.  No masses or hepatosplenomegaly appreciated.  Well-healing incisions. Extremities: Grossly normal range of motion.  Warm, well perfused.  No edema bilaterally. Skin: No rashes or lesions noted. GU: Normal appearing external genitalia without erythema, excoriation, or lesions.  Speculum exam reveals cuff intact, suture still visible, no bleeding or discharge.  Bimanual exam reveals cuff intact, no fluctuance or tenderness with palpation.    Laboratory & Radiologic Studies: A. UTERUS, CERVIX BILATERAL FALLOPIAN TUBES, HYSTERECTOMY, AND  SALPINGECTOMY:  - Uterus:       Endometrium: Inactive endometrium. No hyperplasia or malignancy.       Myometrium: Leiomyomata. No malignancy.       Serosa: Unremarkable. No malignancy.  - Cervix: Benign squamous and endocervical mucosa. No dysplasia or  malignancy.  - Bilateral fallopian tubes: Endometriosis.  Paratubal cysts. No  malignancy.   Assessment & Plan: Dorothy Bouchie  Taylor is a 50 y.o. woman s/p robotic hysterectomy and bilateral salpingectomy for large fibroid uterus who is doing well from a postoperative standpoint.  Patient presents today for postop visit.  She is meeting all postoperative milestones.  We discussed continued restrictions as well as postoperative expectations.  All of patient's questions were answered today.  We discussed that I will release her back to her gynecologist and send my note from today.  She is aware that she can call the clinic if any additional needs arise.  28 minutes of total time was spent for this patient encounter, including preparation, face-to-face counseling with the patient and coordination of care, and documentation of the encounter.  Jeral Pinch, MD  Division of Gynecologic Oncology  Department of Obstetrics and Gynecology  [facility]

## 2021-02-10 ENCOUNTER — Telehealth: Payer: Self-pay

## 2021-02-10 NOTE — Telephone Encounter (Signed)
Following up with Ms. Dorothy Taylor regarding her bill from surgery. Patient reports she was charged for 2 hysterectomies. One from Dr. Delsa Sale and one from Dr. Denman George. Instructed patient that the billing department has been notified and they are working on refilling charges. Patient verbalized understanding.  Patient inquiring how long after surgery can she take a bath. Per Joylene John, NP patient can take a bath 6 weeks after surgery as long as abdominal incisions are closed and she is not having vaginal spotting. Patient verbalized understanding. Instructed to call with any questions or concerns.

## 2021-03-09 ENCOUNTER — Other Ambulatory Visit: Payer: Self-pay | Admitting: Family Medicine

## 2021-03-09 DIAGNOSIS — I10 Essential (primary) hypertension: Secondary | ICD-10-CM

## 2021-03-09 DIAGNOSIS — F339 Major depressive disorder, recurrent, unspecified: Secondary | ICD-10-CM

## 2021-03-17 ENCOUNTER — Encounter: Payer: Self-pay | Admitting: Family Medicine

## 2021-03-17 ENCOUNTER — Ambulatory Visit (INDEPENDENT_AMBULATORY_CARE_PROVIDER_SITE_OTHER): Payer: BC Managed Care – PPO | Admitting: Family Medicine

## 2021-03-17 VITALS — BP 126/98 | HR 77 | Temp 98.1°F | Wt 241.6 lb

## 2021-03-17 DIAGNOSIS — E782 Mixed hyperlipidemia: Secondary | ICD-10-CM

## 2021-03-17 DIAGNOSIS — Z0000 Encounter for general adult medical examination without abnormal findings: Secondary | ICD-10-CM

## 2021-03-17 DIAGNOSIS — R0982 Postnasal drip: Secondary | ICD-10-CM | POA: Diagnosis not present

## 2021-03-17 DIAGNOSIS — Z0001 Encounter for general adult medical examination with abnormal findings: Secondary | ICD-10-CM | POA: Diagnosis not present

## 2021-03-17 DIAGNOSIS — F339 Major depressive disorder, recurrent, unspecified: Secondary | ICD-10-CM

## 2021-03-17 DIAGNOSIS — Z1211 Encounter for screening for malignant neoplasm of colon: Secondary | ICD-10-CM

## 2021-03-17 DIAGNOSIS — R7989 Other specified abnormal findings of blood chemistry: Secondary | ICD-10-CM

## 2021-03-17 DIAGNOSIS — I10 Essential (primary) hypertension: Secondary | ICD-10-CM

## 2021-03-17 LAB — COMPREHENSIVE METABOLIC PANEL
ALT: 44 U/L — ABNORMAL HIGH (ref 0–35)
AST: 24 U/L (ref 0–37)
Albumin: 4 g/dL (ref 3.5–5.2)
Alkaline Phosphatase: 66 U/L (ref 39–117)
BUN: 12 mg/dL (ref 6–23)
CO2: 31 mEq/L (ref 19–32)
Calcium: 9.8 mg/dL (ref 8.4–10.5)
Chloride: 103 mEq/L (ref 96–112)
Creatinine, Ser: 0.71 mg/dL (ref 0.40–1.20)
GFR: 100.01 mL/min (ref >60.00)
Glucose, Bld: 95 mg/dL (ref 70–99)
Potassium: 4 mEq/L (ref 3.5–5.1)
Sodium: 140 mEq/L (ref 135–145)
Total Bilirubin: 0.5 mg/dL (ref 0.2–1.2)
Total Protein: 7.2 g/dL (ref 6.0–8.3)

## 2021-03-17 LAB — CBC WITH DIFFERENTIAL/PLATELET
Basophils Absolute: 0 10*3/uL (ref 0.0–0.1)
Basophils Relative: 0.6 % (ref 0.0–3.0)
Eosinophils Absolute: 0.1 10*3/uL (ref 0.0–0.7)
Eosinophils Relative: 2.4 % (ref 0.0–5.0)
HCT: 40.6 % (ref 36.0–46.0)
Hemoglobin: 13.6 g/dL (ref 12.0–15.0)
Lymphocytes Relative: 39.5 % (ref 12.0–46.0)
Lymphs Abs: 2.4 10*3/uL (ref 0.7–4.0)
MCHC: 33.5 g/dL (ref 30.0–36.0)
MCV: 89.8 fl (ref 78.0–100.0)
Monocytes Absolute: 0.3 10*3/uL (ref 0.1–1.0)
Monocytes Relative: 5.1 % (ref 3.0–12.0)
Neutro Abs: 3.2 10*3/uL (ref 1.4–7.7)
Neutrophils Relative %: 52.4 % (ref 43.0–77.0)
Platelets: 260 10*3/uL (ref 150.0–400.0)
RBC: 4.52 Mil/uL (ref 3.87–5.11)
RDW: 13.2 % (ref 11.5–15.5)
WBC: 6.1 10*3/uL (ref 4.0–10.5)

## 2021-03-17 LAB — LIPID PANEL
Cholesterol: 274 mg/dL — ABNORMAL HIGH (ref 0–200)
HDL: 56.4 mg/dL (ref >39.00)
LDL Cholesterol: 188 mg/dL — ABNORMAL HIGH (ref 0–99)
NonHDL: 217.91
Total CHOL/HDL Ratio: 5
Triglycerides: 151 mg/dL — ABNORMAL HIGH (ref 0.0–149.0)
VLDL: 30.2 mg/dL (ref 0.0–40.0)

## 2021-03-17 LAB — TSH: TSH: 2.25 u[IU]/mL (ref 0.35–5.50)

## 2021-03-17 LAB — T4, FREE: Free T4: 0.78 ng/dL (ref 0.60–1.60)

## 2021-03-17 LAB — HEMOGLOBIN A1C: Hgb A1c MFr Bld: 5.2 % (ref 4.6–6.5)

## 2021-03-17 MED ORDER — ESCITALOPRAM OXALATE 10 MG PO TABS: 10.0000 mg | ORAL_TABLET | Freq: Every day | ORAL | 1 refills | Status: DC

## 2021-03-17 MED ORDER — FLUTICASONE PROPIONATE 50 MCG/ACT NA SUSP: 1.0000 | Freq: Every day | NASAL | 1 refills | Status: DC | PRN

## 2021-03-17 MED ORDER — HYDROCHLOROTHIAZIDE 12.5 MG PO TABS: 12.5000 mg | ORAL_TABLET | Freq: Every day | ORAL | 1 refills | Status: DC

## 2021-03-17 NOTE — Patient Instructions (Addendum)
A prescription for Flonase was sent over to the pharmacy for your nasal drainage. We will have you return to clinic to recheck your blood pressure.  Time decrease the amount of salt you are eating and increase your physical activity.

## 2021-03-17 NOTE — Progress Notes (Signed)
Subjective:     Dorothy Taylor is a 50 y.o. female and is here for a comprehensive physical exam.  Pt states she has had a cold for over a month.  Feels like she is on the "tail end of it" but having occasional cough and nasal drainage.   Pt s/p robotic hysterectomy in Sept.  States pre-op EKG with possible age indeterminate MI.  Pt denies CP, SOB, n/v, dizziness.  Feels some scar tissue (an internal pull) with certain movements.  Otherwise recovering well s/p procedure.  BP at home typically pretty good.  May have a 140 reading.  Taking hydrochlorothiazide daily.  Also taking 2100 mg of fish oil and Lexapro 10 mg daily.  Endorses mood is good.  But thinks it is related to stress and family.  Patient exercising taking baths for self-care.  Orange theory prior to surgery.  Plans to start back.  Social History   Socioeconomic History   Marital status: Married    Spouse name: Not on file   Number of children: Not on file   Years of education: Not on file   Highest education level: Not on file  Occupational History   Not on file  Tobacco Use   Smoking status: Never   Smokeless tobacco: Never  Vaping Use   Vaping Use: Never used  Substance and Sexual Activity   Alcohol use: Yes    Comment: wine- once  - twice per week   Drug use: Never   Sexual activity: Yes  Other Topics Concern   Not on file  Social History Narrative   Not on file   Social Determinants of Health   Financial Resource Strain: Not on file  Food Insecurity: Not on file  Transportation Needs: Not on file  Physical Activity: Not on file  Stress: Not on file  Social Connections: Not on file  Intimate Partner Violence: Not on file   Health Maintenance  Topic Date Due   HIV Screening  Never done   Hepatitis C Screening  Never done   COLONOSCOPY (Pts 45-56yrs Insurance coverage will need to be confirmed)  Never done   INFLUENZA VACCINE  11/09/2020   COVID-19 Vaccine (5 - Booster for Pfizer series)  03/06/2021   TETANUS/TDAP  09/23/2021   PAP SMEAR-Modifier  10/10/2023   Pneumococcal Vaccine 52-46 Years old  Aged Out   HPV VACCINES  Aged Out    The following portions of the patient's history were reviewed and updated as appropriate: allergies, current medications, past family history, past medical history, past social history, past surgical history, and problem list.  Review of Systems Pertinent items noted in HPI and remainder of comprehensive ROS otherwise negative.   Objective:    BP (!) 126/98 (BP Location: Right Arm, Patient Position: Sitting, Cuff Size: Normal)   Pulse 77   Temp 98.1 F (36.7 C) (Oral)   Wt 241 lb 9.6 oz (109.6 kg)   SpO2 99%   BMI 39.00 kg/m  General appearance: alert, cooperative, and no distress Head: Normocephalic, without obvious abnormality, atraumatic Eyes: conjunctivae/corneas clear. PERRL, EOM's intact. Fundi benign. Ears: normal TM's and external ear canals both ears Nose: Nares normal. Septum midline. Mucosa normal. No drainage or sinus tenderness. Throat: lips, mucosa, and tongue normal; teeth and gums normal Neck: no adenopathy, no carotid bruit, no JVD, supple, symmetrical, trachea midline, and thyroid not enlarged, symmetric, no tenderness/mass/nodules Lungs: clear to auscultation bilaterally Heart: regular rate and rhythm, S1, S2 normal, no murmur, click, rub  or gallop Abdomen: soft, non-tender; bowel sounds normal; no masses,  no organomegaly Extremities: extremities normal, atraumatic, no cyanosis or edema Pulses: 2+ and symmetric Skin: Skin color, texture, turgor normal. No rashes or lesions Lymph nodes: Cervical, supraclavicular, and axillary nodes normal. Neurologic: Alert and oriented X 3, normal strength and tone. Normal symmetric reflexes. Normal coordination and gait    Assessment:    Healthy female exam.      Plan:    Anticipatory guidance given including wearing seatbelts, smoke detectors in the home, increasing  physical activity, increasing p.o. intake of water and vegetables. -labs -s/p hysterectomy September 2022 -Reviewed immunizations -Colonoscopy due -Given handout -Next CPE in 1 year See After Visit Summary for Counseling Recommendations   Post-nasal drainage -Supportive care - Plan: fluticasone (FLONASE) 50 MCG/ACT nasal spray  Essential hypertension  -Elevated -Lifestyle modifications encouraged -Continue hydrochlorothiazide 12.5 mg daily -Patient to check BP at home.  For consistent elevation greater than 140/90, will increase BP medication - Plan: hydrochlorothiazide (HYDRODIURIL) 12.5 MG tablet, CMP, Lipid panel  Elevated LFTs -AST 60 and ALT 82 on 12/24/2010. - Plan: CMP  Depression, recurrent (HCC) -Stable -PHQ-9 score 2 -GAD-7 score 0 Continue supportive care -Continue Lexapro 10 mg daily - Plan: escitalopram (LEXAPRO) 10 MG tablet, TSH, T4, Free  Mixed hyperlipidemia  -Lifestyle modification strongly encouraged given concern for age-indeterminate MI on preop EKG -Continue fish oil 2100 mg daily -For continued elevation in lipids start statin - Plan: Hemoglobin A1c, Lipid panel  Colon cancer screening  - Plan: Ambulatory referral to Gastroenterology  Follow-up in 1 month for HTN  Grier Mitts, MD

## 2021-04-14 ENCOUNTER — Ambulatory Visit (INDEPENDENT_AMBULATORY_CARE_PROVIDER_SITE_OTHER): Payer: 59 | Admitting: Family Medicine

## 2021-04-14 VITALS — BP 132/92 | HR 82 | Temp 99.6°F | Wt 241.2 lb

## 2021-04-14 DIAGNOSIS — M722 Plantar fascial fibromatosis: Secondary | ICD-10-CM

## 2021-04-14 DIAGNOSIS — M791 Myalgia, unspecified site: Secondary | ICD-10-CM | POA: Diagnosis not present

## 2021-04-14 DIAGNOSIS — R600 Localized edema: Secondary | ICD-10-CM

## 2021-04-14 DIAGNOSIS — R202 Paresthesia of skin: Secondary | ICD-10-CM | POA: Diagnosis not present

## 2021-04-14 DIAGNOSIS — I10 Essential (primary) hypertension: Secondary | ICD-10-CM | POA: Diagnosis not present

## 2021-04-14 DIAGNOSIS — E782 Mixed hyperlipidemia: Secondary | ICD-10-CM

## 2021-04-14 DIAGNOSIS — R42 Dizziness and giddiness: Secondary | ICD-10-CM | POA: Diagnosis not present

## 2021-04-14 DIAGNOSIS — M2550 Pain in unspecified joint: Secondary | ICD-10-CM | POA: Diagnosis not present

## 2021-04-14 DIAGNOSIS — R0982 Postnasal drip: Secondary | ICD-10-CM

## 2021-04-14 MED ORDER — ROSUVASTATIN CALCIUM 5 MG PO TABS: ORAL_TABLET | ORAL | 3 refills | Status: DC

## 2021-04-14 MED ORDER — LOSARTAN POTASSIUM 25 MG PO TABS: 25.0000 mg | ORAL_TABLET | Freq: Every day | ORAL | 3 refills | Status: DC

## 2021-04-14 NOTE — Progress Notes (Signed)
Subjective:    Patient ID: Dorothy Taylor, female    DOB: [birth year], 51 y.o.   MRN: [id number]  Chief Complaint  Patient presents with   Hypertension    May need statin, circulation issues in hands and feet, feeling like pins and needles.    Joint Swelling    Swelling in feet and ankles.Marland Kitchen    HPI Patient was seen today for ongoing concerns.  Pt endorses pain along bottom of foot x a while.  Felt with stepping out of bed in am.  Endorses intermittent paresthesias in toes and ankles x 4-5 months.  Occasionally has sensation in hands.  Also notes edema in feet and ankles, and achy legs and joints x 4-5 months.  BP elevated, 150s/90s this wk.  Endorses episodes of intermittent dizziness x months.  Denies HAs, CP, changes in vision.  Drinking 40-60 oz of water and several cups of coffee per day.  Past Medical History:  Diagnosis Date   Depression    Hyperlipidemia    Hypertension     No Known Allergies  ROS General: Denies fever, chills, night sweats, changes in weight, changes in appetite +dizziness HEENT: Denies headaches, ear pain, changes in vision, rhinorrhea, sore throat CV: Denies CP, palpitations, SOB, orthopnea  +edema of feet and ankles Pulm: Denies SOB, cough, wheezing GI: Denies abdominal pain, nausea, vomiting, diarrhea, constipation GU: Denies dysuria, hematuria, frequency, vaginal discharge Msk: +Muscle cramps, joint pains, pain in plantar surface of foot Neuro: Denies weakness +numbness, tingling Skin: Denies rashes, bruising   Psych: Denies depression, anxiety, hallucinations  Objective:    Blood pressure (!) 132/92, pulse 82, temperature 99.6 F (37.6 C), temperature source Oral, weight 241 lb 3.2 oz (109.4 kg), SpO2 98 %.  Gen. Pleasant, well-nourished, in no distress, normal affect   HEENT: [place]/AT, face symmetric, conjunctiva clear, no scleral icterus, PERRLA, EOMI, nares patent without drainage, pharynx with post nasal drainage, no erythema or  exudate.  TMs full b/l. Neck: No JVD, no thyromegaly, L carotid bruits Lungs: no accessory muscle use, CTAB, no wheezes or rales Cardiovascular: RRR, no m/r/g, no peripheral edema Musculoskeletal: No deformities, no cyanosis or clubbing, normal tone Neuro:  A&Ox3, CN II-XII intact, normal gait Skin:  Warm, no lesions/ rash  Wt Readings from Last 3 Encounters:  04/14/21 241 lb 3.2 oz (109.4 kg)  03/17/21 241 lb 9.6 oz (109.6 kg)  01/25/21 242 lb (109.8 kg)    Lab Results  Component Value Date   WBC 6.1 03/17/2021   HGB 13.6 03/17/2021   HCT 40.6 03/17/2021   PLT 260.0 03/17/2021   GLUCOSE 95 03/17/2021   CHOL 274 (H) 03/17/2021   TRIG 151.0 (H) 03/17/2021   HDL 56.40 03/17/2021   LDLDIRECT 140.0 03/15/2019   LDLCALC 188 (H) 03/17/2021   ALT 44 (H) 03/17/2021   AST 24 03/17/2021   NA 140 03/17/2021   K 4.0 03/17/2021   CL 103 03/17/2021   CREATININE 0.71 03/17/2021   BUN 12 03/17/2021   CO2 31 03/17/2021   TSH 2.25 03/17/2021   INR 0.9 01/05/2021   HGBA1C 5.2 03/17/2021    Assessment/Plan:  Paresthesias - Plan: Basic metabolic panel, Vitamin S56, Folate, TSH  Plantar fasciitis of right foot -stretching exercises, supportive shoes -for continued symptoms consider steroids  Essential hypertension  -uncontrolled -continue HCTZ 12.5 mg daily -given continued elevation will start losartan 25 mg -Check BP at home regularly and keep log to bring to clinic. - Plan: Basic metabolic panel, US Carotid  Duplex Bilateral, losartan (COZAAR) 25 MG tablet  Mixed hyperlipidemia  -total cholesterol 274, triglycerides 151, and LDL 188 on 03/17/21 -lifestyle modifications - Plan: rosuvastatin (CRESTOR) 5 MG tablet, US Carotid Duplex Bilateral  Bilateral leg edema -treatment of symptoms: decrease sodium intake, elevated LEs, TED hose or compression socks.  - Plan: Lupus (SLE) Analysis, Brain Natriuretic Peptide, Basic metabolic panel, Sedimentation Rate, US Carotid Duplex  Bilateral  Multiple joint pain  -OTC NSAIDs,Tylenol, or topical analgesics, heat, rest, wt loss. -labs to evaluate autoimmune etiology - Plan: Lupus (SLE) Analysis, Sedimentation Rate, CBC with Differential/Platelet  Dizziness  -possibly due to hypo or hyperglycemia, hypo or hypertension, medications -advised to move slowly when changing position -decrease caffeine intake.   - Plan: CBC with Differential/Platelet, US Carotid Duplex Bilateral  Myalgia -discussed possible causes including electrolyte deficiencies, medications, etc -obtain labs  - Plan: Lupus (SLE) Analysis, Basic metabolic panel, Magnesium, Folate, CBC with Differential/Platelet  Post-nasal drainage -OTC flonase, antihistamine, or saline nasal rinse  F/u prn in 1 month  More than 50% of over 35+ minutes spent in total in caring for this patient was spent face-to-face, reviewing the chart, counseling and/or coordinating care.    Grier Mitts, MD

## 2021-04-14 NOTE — Patient Instructions (Addendum)
An order was placed for you to have a carotid ultrasound.  This is an ultrasound sides of neck to look for any blockages in the arteries that may be contributing to the elevation in blood pressure and swelling in your legs.  You should expect to receive a phone call regarding scheduling this.  A prescription for Crestor 5 mg was sent to your pharmacy for cholesterol.  You can take this a few times a week.  Prescription for losartan 25 mg (a blood pressure medication) was sent to your pharmacy.  You can take this daily.  Continue to monitor your blood pressure readings.  We will plan to have you follow-up in clinic in the next 2-4 weeks.

## 2021-04-15 LAB — CBC WITH DIFFERENTIAL/PLATELET
Basophils Absolute: 0.1 K/uL (ref 0.0–0.1)
Basophils Relative: 0.6 % (ref 0.0–3.0)
Eosinophils Absolute: 0.1 K/uL (ref 0.0–0.7)
Eosinophils Relative: 1.7 % (ref 0.0–5.0)
HCT: 42.4 % (ref 36.0–46.0)
Hemoglobin: 14.2 g/dL (ref 12.0–15.0)
Lymphocytes Relative: 36.9 % (ref 12.0–46.0)
Lymphs Abs: 2.9 K/uL (ref 0.7–4.0)
MCHC: 33.5 g/dL (ref 30.0–36.0)
MCV: 89.4 fl (ref 78.0–100.0)
Monocytes Absolute: 0.3 K/uL (ref 0.1–1.0)
Monocytes Relative: 3.9 % (ref 3.0–12.0)
Neutro Abs: 4.5 K/uL (ref 1.4–7.7)
Neutrophils Relative %: 56.9 % (ref 43.0–77.0)
Platelets: 279 K/uL (ref 150.0–400.0)
RBC: 4.75 Mil/uL (ref 3.87–5.11)
RDW: 13.4 % (ref 11.5–15.5)
WBC: 7.9 K/uL (ref 4.0–10.5)

## 2021-04-15 LAB — BASIC METABOLIC PANEL
BUN: 13 mg/dL (ref 6–23)
CO2: 28 mEq/L (ref 19–32)
Calcium: 9.8 mg/dL (ref 8.4–10.5)
Chloride: 101 mEq/L (ref 96–112)
Creatinine, Ser: 0.71 mg/dL (ref 0.40–1.20)
GFR: 99.95 mL/min (ref >60.00)
Glucose, Bld: 85 mg/dL (ref 70–99)
Potassium: 3.9 mEq/L (ref 3.5–5.1)
Sodium: 139 mEq/L (ref 135–145)

## 2021-04-15 LAB — BRAIN NATRIURETIC PEPTIDE: Pro B Natriuretic peptide (BNP): 15 pg/mL (ref 0.0–100.0)

## 2021-04-15 LAB — MAGNESIUM: Magnesium: 2.2 mg/dL (ref 1.5–2.5)

## 2021-04-15 LAB — VITAMIN B12: Vitamin B-12: 358 pg/mL (ref 211–911)

## 2021-04-15 LAB — TSH: TSH: 1.78 u[IU]/mL (ref 0.35–5.50)

## 2021-04-15 LAB — SEDIMENTATION RATE: Sed Rate: 30 mm/hr — ABNORMAL HIGH (ref 0–20)

## 2021-04-15 LAB — FOLATE: Folate: >24.2 ng/mL (ref >5.9)

## 2021-04-16 ENCOUNTER — Encounter: Payer: Self-pay | Admitting: Family Medicine

## 2021-04-18 ENCOUNTER — Encounter: Payer: Self-pay | Admitting: Family Medicine

## 2021-04-20 LAB — LUPUS (SLE) ANALYSIS
Anti Nuclear Antibody (ANA): NEGATIVE
Anti-striation Abs: NEGATIVE
Complement C4, Serum: 50 mg/dL — ABNORMAL HIGH (ref 12–38)
ENA RNP Ab: <0.2 AI (ref 0.0–0.9)
ENA SM Ab Ser-aCnc: <0.2 AI (ref 0.0–0.9)
ENA SSA (RO) Ab: <0.2 AI (ref 0.0–0.9)
ENA SSB (LA) Ab: <0.2 AI (ref 0.0–0.9)
Mitochondrial Ab: <20 Units (ref 0.0–20.0)
Parietal Cell Ab: 5.8 Units (ref 0.0–20.0)
Scleroderma (Scl-70) (ENA) Antibody, IgG: <0.2 AI (ref 0.0–0.9)
Smooth Muscle Ab: 4 Units (ref 0–19)
Thyroperoxidase Ab SerPl-aCnc: 9 IU/mL (ref 0–34)
dsDNA Ab: <1 IU/mL (ref 0–9)

## 2021-04-21 ENCOUNTER — Ambulatory Visit
Admission: RE | Admit: 2021-04-21 | Discharge: 2021-04-21 | Disposition: A | Discharge status: Home / Self Care | Payer: 59 | Source: Ambulatory Visit | Attending: Family Medicine | Admitting: Family Medicine

## 2021-04-21 DIAGNOSIS — R600 Localized edema: Secondary | ICD-10-CM

## 2021-04-21 DIAGNOSIS — E782 Mixed hyperlipidemia: Secondary | ICD-10-CM

## 2021-04-21 DIAGNOSIS — R42 Dizziness and giddiness: Secondary | ICD-10-CM

## 2021-04-21 DIAGNOSIS — I10 Essential (primary) hypertension: Secondary | ICD-10-CM

## 2021-04-29 ENCOUNTER — Telehealth: Payer: 59 | Admitting: Family Medicine

## 2021-04-29 DIAGNOSIS — J019 Acute sinusitis, unspecified: Secondary | ICD-10-CM

## 2021-04-29 DIAGNOSIS — B9689 Other specified bacterial agents as the cause of diseases classified elsewhere: Secondary | ICD-10-CM

## 2021-04-29 MED ORDER — DOXYCYCLINE HYCLATE 100 MG PO TABS: 100.0000 mg | ORAL_TABLET | Freq: Two times a day (BID) | ORAL | 0 refills | Status: DC

## 2021-04-29 NOTE — Progress Notes (Signed)
Virtual Visit Consent   Dorothy Taylor, you are scheduled for a virtual visit with a [facility] provider today.     Just as with appointments in the office, your consent must be obtained to participate.  Your consent will be active for this visit and any virtual visit you may have with one of our providers in the next 365 days.     If you have a MyChart account, a copy of this consent can be sent to you electronically.  All virtual visits are billed to your insurance company just like a traditional visit in the office.    As this is a virtual visit, video technology does not allow for your provider to perform a traditional examination.  This may limit your provider's ability to fully assess your condition.  If your provider identifies any concerns that need to be evaluated in person or the need to arrange testing (such as labs, EKG, etc.), we will make arrangements to do so.     Although advances in technology are sophisticated, we cannot ensure that it will always work on either your end or our end.  If the connection with a video visit is poor, the visit may have to be switched to a telephone visit.  With either a video or telephone visit, we are not always able to ensure that we have a secure connection.     I need to obtain your verbal consent now.   Are you willing to proceed with your visit today?    Dorothy Taylor has provided verbal consent on 04/29/2021 for a virtual visit (video or telephone).   Perlie Mayo, NP   Date: 04/29/2021 8:32 AM   Virtual Visit via Video Note   I, Perlie Mayo, connected with  Dorothy Taylor  ([id number], [birth year]) on 04/29/21 at  8:30 AM EST by a video-enabled telemedicine application and verified that I am speaking with the correct person using two identifiers.  Location: Patient: Virtual Visit Location Patient: Home Provider: Virtual Visit Location Provider: Home Office   I discussed the limitations of  evaluation and management by telemedicine and the availability of in person appointments. The patient expressed understanding and agreed to proceed.    History of Present Illness: Dorothy Taylor is a 51 y.o. who identifies as a female who was assigned female at birth, and is being seen today for cold symptoms.  HPI: Sinusitis This is a new problem. The current episode started in the past 7 days. The problem has been rapidly worsening since onset. There has been no fever (99.9). The fever has been present for 1 to 2 days. The pain is mild. Associated symptoms include congestion, coughing, diaphoresis, ear pain, headaches, a hoarse voice, sinus pressure, sneezing, a sore throat and swollen glands. Pertinent negatives include no chills or shortness of breath. Past treatments include acetaminophen, oral decongestants and spray decongestants. The treatment provided mild relief.    COVID Neg  Vaccinated for COVID - no flu this year  Problems:  Patient Active Problem List   Diagnosis Date Noted   Fibroids, intramural 12/09/2020   Obesity, Class III, BMI 40-49.9 (morbid obesity) ([place]) 12/09/2020   Essential hypertension 03/17/2019   Depression, recurrent ([place]) 03/17/2019   Hyperlipidemia 03/17/2019    Allergies: No Known Allergies Medications:  Current Outpatient Medications:    escitalopram (LEXAPRO) 10 MG tablet, Take 1 tablet (10 mg total) by mouth daily., Disp: 90 tablet, Rfl: 1   fluticasone (FLONASE) 50 MCG/ACT  nasal spray, Place 1 spray into both nostrils daily as needed for allergies. Still taking daily, Disp: 9.9 mL, Rfl: 1   hydrochlorothiazide (HYDRODIURIL) 12.5 MG tablet, Take 1 tablet (12.5 mg total) by mouth daily., Disp: 90 tablet, Rfl: 1   losartan (COZAAR) 25 MG tablet, Take 1 tablet (25 mg total) by mouth daily., Disp: 30 tablet, Rfl: 3   Multiple Vitamins-Minerals (MULTIVITAMIN WITH MINERALS) tablet, Take 1 tablet by mouth daily., Disp: , Rfl:    Omega-3 Fatty  Acids (FISH OIL) 1000 MG CAPS, Take by mouth., Disp: , Rfl:    PSYLLIUM HUSK PO, Take 1,400 mg by mouth daily., Disp: , Rfl:    rosuvastatin (CRESTOR) 5 MG tablet, Take 1 tab 3 times a week., Disp: 30 tablet, Rfl: 3  Observations/Objective: Patient is well-developed, well-nourished in no acute distress.  Resting comfortably  at home.  Head is normocephalic, atraumatic.  No labored breathing.  Speech is clear and coherent with logical content.  Patient is alert and oriented at baseline.  Nasal tone  Assessment and Plan:  1. Acute bacterial sinusitis  S&S consistent with sinus infection- post viral cold a over 10 days ago. Tx as per below COVID neg--- no flu exposure- needs vaccine for this- will get next week  - doxycycline (VIBRA-TABS) 100 MG tablet; Take 1 tablet (100 mg total) by mouth 2 (two) times daily for 10 days.  Dispense: 20 tablet; Refill: 0   Reviewed side effects, risks and benefits of medication.    Patient acknowledged agreement and understanding of the plan.      Follow Up Instructions: I discussed the assessment and treatment plan with the patient. The patient was provided an opportunity to ask questions and all were answered. The patient agreed with the plan and demonstrated an understanding of the instructions.  A copy of instructions were sent to the patient via MyChart unless otherwise noted below.    The patient was advised to call back or seek an in-person evaluation if the symptoms worsen or if the condition fails to improve as anticipated.  Time:  I spent 10 minutes with the patient via telehealth technology discussing the above problems/concerns.    Perlie Mayo, NP

## 2021-04-29 NOTE — Patient Instructions (Signed)

## 2021-04-30 ENCOUNTER — Telehealth: Payer: 59 | Admitting: Family Medicine

## 2021-05-07 ENCOUNTER — Encounter: Payer: Self-pay | Admitting: Family Medicine

## 2021-05-07 ENCOUNTER — Ambulatory Visit: Payer: 59 | Admitting: Family Medicine

## 2021-05-07 VITALS — BP 130/82 | HR 56 | Temp 98.1°F | Ht 66.0 in | Wt 241.0 lb

## 2021-05-07 DIAGNOSIS — I10 Essential (primary) hypertension: Secondary | ICD-10-CM | POA: Diagnosis not present

## 2021-05-07 DIAGNOSIS — J018 Other acute sinusitis: Secondary | ICD-10-CM

## 2021-05-07 DIAGNOSIS — H6982 Other specified disorders of Eustachian tube, left ear: Secondary | ICD-10-CM | POA: Diagnosis not present

## 2021-05-07 MED ORDER — CEFDINIR 300 MG PO CAPS: 300.0000 mg | ORAL_CAPSULE | Freq: Two times a day (BID) | ORAL | 0 refills | Status: AC

## 2021-05-07 NOTE — Progress Notes (Signed)
Subjective:    Patient ID: Dorothy Taylor, female    DOB: [birth year], 51 y.o.   MRN: [id number]  Chief Complaint  Patient presents with   Follow-up    B/p    HPI Patient was seen today for follow-up on BP and ongoing illness.  Pt seen virtually 04/29/21 for viral illness x 7 days.  Given rx for doxy x 10 days for sinusitis.  Patient notes mild improvement in symptoms.  Still having nasal congestion, productive cough, fatigue, left ear popping. Using flonase daily.  Pt has been sick off and on for the last few months.  Things became sick recently after visiting her mother.  Notes family hx of nasal/sinsus issues.  Pt's mom had sinus surgery.  Tried OTC cough/cold medications such as DayQuil and Mucinex.  Checking BP intermittently.  Notes improvement since starting losartan 25 mg.  Also states before becoming sick felt better overall.  Taking cholesterol med 2 d/wk. patient endorses not getting much sleep last night as she has new puppies.  Past Medical History:  Diagnosis Date   Depression    Hyperlipidemia    Hypertension     No Known Allergies  ROS General: Denies fever, chills, night sweats, changes in weight, changes in appetite HEENT: Denies headaches, ear pain, changes in vision, rhinorrhea, sore throat + nasal congestion, left ear popping, chronic rhinitis/sinus issues. CV: Denies CP, palpitations, SOB, orthopnea Pulm: Denies SOB, cough, wheezing +cough GI: Denies abdominal pain, nausea, vomiting, diarrhea, constipation GU: Denies dysuria, hematuria, frequency, vaginal discharge Msk: Denies muscle cramps, joint pains Neuro: Denies weakness, numbness, tingling Skin: Denies rashes, bruising Psych: Denies depression, anxiety, hallucinations    Objective:    Blood pressure 130/82, pulse (!) 56, temperature 98.1 F (36.7 C), temperature source Oral, height 5\' 6"  (1.676 m), weight 241 lb (109.3 kg), last menstrual period 07/10/2019, SpO2 98 %.  Gen. Pleasant,  well-nourished, in no distress, normal affect   HEENT: [place]/AT, face symmetric, edematous skin underneath eyes, conjunctiva clear, no scleral icterus, PERRLA, EOMI, nares patent without drainage, pharynx clear postnasal drainage, no erythema or exudate.  TMs full bilaterally.  No cervical lymphadenopathy. Lungs: Intermittent productive sounding cough, no accessory muscle use, CTAB, no wheezes or rales Cardiovascular: RRR, no m/r/g, no peripheral edema Musculoskeletal: No deformities, no cyanosis or clubbing, normal tone Neuro:  A&Ox3, CN II-XII intact, normal gait Skin:  Warm, no lesions/ rash   Wt Readings from Last 3 Encounters:  05/07/21 241 lb (109.3 kg)  04/14/21 241 lb 3.2 oz (109.4 kg)  03/17/21 241 lb 9.6 oz (109.6 kg)    Lab Results  Component Value Date   WBC 7.9 04/14/2021   HGB 14.2 04/14/2021   HCT 42.4 04/14/2021   PLT 279.0 04/14/2021   GLUCOSE 85 04/14/2021   CHOL 274 (H) 03/17/2021   TRIG 151.0 (H) 03/17/2021   HDL 56.40 03/17/2021   LDLDIRECT 140.0 03/15/2019   LDLCALC 188 (H) 03/17/2021   ALT 44 (H) 03/17/2021   AST 24 03/17/2021   NA 139 04/14/2021   K 3.9 04/14/2021   CL 101 04/14/2021   CREATININE 0.71 04/14/2021   BUN 13 04/14/2021   CO2 28 04/14/2021   TSH 1.78 04/14/2021   INR 0.9 01/05/2021   HGBA1C 5.2 03/17/2021    Assessment/Plan:  Essential hypertension -Improving.  Mildly elevated likely 2/2 current illness and use of OTC decongestant/cold medicines -Continue lifestyle modifications -Continue losartan 25 mg daily  Other subacute sinusitis  -Due to continued symptoms with minimal  improvement after 9 days ABX will discontinue doxycycline. -We will start cefdinir x10 days -Continue Flonase/nasal saline rinse. -Consider p.o. antihistamine -For continued or worsening symptoms ENT referral - Plan: cefdinir (OMNICEF) 300 MG capsule  Eustachian tube dysfunction, left -Likely 2/2 recent viral infection/chronic sinusitis -OTC antihistamines  and nasal spray  F/u as needed  Grier Mitts, MD

## 2021-05-14 ENCOUNTER — Ambulatory Visit: Payer: 59 | Admitting: Family Medicine

## 2021-07-10 ENCOUNTER — Other Ambulatory Visit: Payer: Self-pay | Admitting: Family Medicine

## 2021-07-10 DIAGNOSIS — I10 Essential (primary) hypertension: Secondary | ICD-10-CM

## 2021-09-30 ENCOUNTER — Telehealth: Payer: Self-pay | Admitting: Family Medicine

## 2021-09-30 DIAGNOSIS — F339 Major depressive disorder, recurrent, unspecified: Secondary | ICD-10-CM

## 2021-09-30 DIAGNOSIS — I10 Essential (primary) hypertension: Secondary | ICD-10-CM

## 2021-09-30 MED ORDER — ESCITALOPRAM OXALATE 10 MG PO TABS: 10.0000 mg | ORAL_TABLET | Freq: Every day | ORAL | 1 refills | Status: DC

## 2021-09-30 MED ORDER — HYDROCHLOROTHIAZIDE 12.5 MG PO TABS: 12.5000 mg | ORAL_TABLET | Freq: Every day | ORAL | 1 refills | Status: DC

## 2021-09-30 MED ORDER — LOSARTAN POTASSIUM 25 MG PO TABS: 25.0000 mg | ORAL_TABLET | Freq: Every day | ORAL | 1 refills | Status: DC

## 2021-09-30 NOTE — Telephone Encounter (Signed)
Rx sent to requested pharmacy

## 2021-09-30 NOTE — Telephone Encounter (Signed)
Pt would like a refill of the following: losartan (COZAAR) 25 MG tablet escitalopram (LEXAPRO) 10 MG tablet hydrochlorothiazide (HYDRODIURIL) 12.5 MG tablet  [id number] [facility] [address]:  [phone] Fax:  [phone]

## 2021-10-04 ENCOUNTER — Other Ambulatory Visit: Payer: Self-pay | Admitting: Family Medicine

## 2021-10-04 DIAGNOSIS — I10 Essential (primary) hypertension: Secondary | ICD-10-CM

## 2021-10-06 ENCOUNTER — Encounter: Payer: Self-pay | Admitting: Internal Medicine

## 2021-10-27 ENCOUNTER — Ambulatory Visit (AMBULATORY_SURGERY_CENTER): Payer: BC Managed Care – PPO | Admitting: *Deleted

## 2021-10-27 VITALS — Ht 66.0 in | Wt 242.0 lb

## 2021-10-27 DIAGNOSIS — Z1211 Encounter for screening for malignant neoplasm of colon: Secondary | ICD-10-CM

## 2021-10-27 NOTE — Progress Notes (Signed)
No egg or soy allergy known to patient  No issues known to pt with past sedation with any surgeries or procedures Patient denies ever being told they had issues or difficulty with intubation  No FH of Malignant Hyperthermia Pt is not on diet pills Pt is not on  home 02  Pt is not on blood thinners  Pt denies issues with constipation  No A fib or A flutter Have any cardiac testing pending--denied Pt instructed to use [email or web address] or GoodRx for a price reduction on prep   

## 2021-10-29 ENCOUNTER — Telehealth: Payer: Self-pay | Admitting: Family Medicine

## 2021-10-29 ENCOUNTER — Encounter: Payer: Self-pay | Admitting: Family Medicine

## 2021-10-29 ENCOUNTER — Telehealth (INDEPENDENT_AMBULATORY_CARE_PROVIDER_SITE_OTHER): Payer: BC Managed Care – PPO | Admitting: Family Medicine

## 2021-10-29 VITALS — HR 60 | Ht 66.0 in

## 2021-10-29 DIAGNOSIS — R0982 Postnasal drip: Secondary | ICD-10-CM | POA: Diagnosis not present

## 2021-10-29 DIAGNOSIS — U071 COVID-19: Secondary | ICD-10-CM

## 2021-10-29 MED ORDER — MOLNUPIRAVIR EUA 200MG CAPSULE: 4.0000 | ORAL_CAPSULE | Freq: Two times a day (BID) | ORAL | 0 refills | Status: AC

## 2021-10-29 MED ORDER — FLUTICASONE PROPIONATE 50 MCG/ACT NA SUSP: 1.0000 | Freq: Every day | NASAL | 1 refills | Status: DC | PRN

## 2021-10-29 NOTE — Telephone Encounter (Signed)
Patient calling in with respiratory symptoms: Shortness of breath, chest pain, palpitations or other red words send to Triage  Does the patient have a fever over 100, cough, congestion, sore throat, runny nose, lost of taste/smell (please list symptoms that patient has)? Sore throat, sinus drainage  What date did symptoms start?10-29-2021 (If over 5 days ago, pt may be scheduled for in person visit)  Have you tested for Covid in the last 5 days? Yes   If yes, was it positive '[]'$  OR negative '[]'$ ? If positive in the last 5 days, please schedule virtual visit now. If negative, schedule for an in person OV with the next available provider if PCP has no openings. Please also let patient know they will be tested again (follow the script below)  "you will have to arrive 61mns prior to your appt time to be Covid tested. Please park in back of office at the cone & call [phone] let the staff know you have arrived. A staff member will meet you at your car to do a rapid covid test. Once the test has resulted you will be notified by phone of your results to determine if appt will remain an in person visit or be converted to a virtual/phone visit. If you arrive less than 376ms before your appt time, your visit will be automatically converted to virtual & any recommended testing will happen AFTER the visit." Pt has virtual 10-29-2021  THINGS TO REMEMBER  If no availability for virtual visit in office,  please schedule another [place] office  [place], please instruct patient that they can schedule an evisit or virtual visit through their mychart account. Visits up to 8pm  patients can be seen in office 5 days after positive COVID test

## 2021-10-29 NOTE — Progress Notes (Signed)
Virtual Visit via Video Note I connected with Dorothy Taylor on 10/29/21 by a video enabled telemedicine application and verified that I am speaking with the correct person using two identifiers.  Location patient: home Location provider:work office Persons participating in the virtual visit: patient, provider  I discussed the limitations of evaluation and management by telemedicine and the availability of in person appointments. The patient expressed understanding and agreed to proceed.  Chief Complaint  Patient presents with   Covid Positive   HPI: Ms. Dorothy Taylor is a 51 yo female with hx of HTN, depression, hyperlipidemia, and BMI 39 c/o < 24 hours of postnasal drainage, mild sore throat, fatigue, and mild frontal headache. She had a positive COVID 19 test at home this morning. Her wife has been sick and tested positive for COVID 19 infection.  Negative for fever, chills, anosmia,ageusia, nasal congestion, rhinorrhea, cough, wheezing, dyspnea, CP, abdominal pain, changes in bowel habits, nausea, vomiting, urinary symptoms, or a skin rash. No changes in chronic myalgias/arthralgias. She has not tried OTC medications.  Lab Results  Component Value Date   ALT 44 (H) 03/17/2021   AST 24 03/17/2021   ALKPHOS 66 03/17/2021   BILITOT 0.5 03/17/2021   ROS: See pertinent positives and negatives per HPI.  Past Medical History:  Diagnosis Date   Allergy    Anxiety    Blood transfusion without reported diagnosis    Depression    Hyperlipidemia    Hypertension    Past Surgical History:  Procedure Laterality Date   ROBOTIC ASSISTED LAPAROSCOPIC HYSTERECTOMY AND SALPINGECTOMY Bilateral 01/05/2021   Procedure: XI ROBOTIC ASSISTED LAPAROSCOPIC HYSTERECTOMY GREATER THAN TWO HUNDRED AND FIFTY GRAMS AND SALPINGECTOMY WITH MINI LAPAROTOMY;  Surgeon: Everitt Amber, MD;  Location: [facility];  Service: Gynecology;  Laterality: Bilateral;    Family History  Problem Relation Age of Onset    Colon polyps Mother    Hypertension Mother    Hyperlipidemia Mother    Pancreatic cancer Father    Hyperlipidemia Father    Hypertension Father    Heart attack Father    Depression Father    Cancer Father    Asthma Sister    Depression Sister    Colon cancer Paternal Aunt    Hypertension Maternal Grandmother    Hyperlipidemia Maternal Grandmother    Hypertension Maternal Grandfather    Hyperlipidemia Maternal Grandfather    Heart disease Maternal Grandfather    Heart attack Maternal Grandfather    Esophageal cancer Neg Hx    Rectal cancer Neg Hx    Stomach cancer Neg Hx     Social History   Socioeconomic History   Marital status: Married    Spouse name: Not on file   Number of children: Not on file   Years of education: Not on file   Highest education level: Bachelor's degree (e.g., BA, AB, BS)  Occupational History   Not on file  Tobacco Use   Smoking status: Never   Smokeless tobacco: Never  Vaping Use   Vaping Use: Never used  Substance and Sexual Activity   Alcohol use: Yes    Comment: wine- once  - twice per week   Drug use: Never   Sexual activity: Yes  Other Topics Concern   Not on file  Social History Narrative   Not on file   Social Determinants of Health   Financial Resource Strain: Low Risk  (04/13/2021)   Overall Financial Resource Strain (CARDIA)    Difficulty of Paying Living Expenses:  Not hard at all  Food Insecurity: No Food Insecurity (04/13/2021)   Hunger Vital Sign    Worried About Running Out of Food in the Last Year: Never true    Ran Out of Food in the Last Year: Never true  Transportation Needs: No Transportation Needs (04/13/2021)   PRAPARE - Hydrologist (Medical): No    Lack of Transportation (Non-Medical): No  Physical Activity: Insufficiently Active (04/13/2021)   Exercise Vital Sign    Days of Exercise per Week: 3 days    Minutes of Exercise per Session: 40 min  Stress: No Stress Concern Present  (04/13/2021)   [place]    Feeling of Stress : Not at all  Social Connections: Moderately Isolated (04/13/2021)   Social Connection and Isolation Panel [NHANES]    Frequency of Communication with Friends and Family: More than three times a week    Frequency of Social Gatherings with Friends and Family: Twice a week    Attends Religious Services: Never    Marine scientist or Organizations: No    Attends Music therapist: Not on file    Marital Status: Married  Human resources officer Violence: Not on file   Current Outpatient Medications:    escitalopram (LEXAPRO) 10 MG tablet, Take 1 tablet (10 mg total) by mouth daily., Disp: 90 tablet, Rfl: 1   hydrochlorothiazide (HYDRODIURIL) 12.5 MG tablet, Take 1 tablet (12.5 mg total) by mouth daily., Disp: 90 tablet, Rfl: 1   losartan (COZAAR) 25 MG tablet, Take 1 tablet (25 mg total) by mouth daily., Disp: 90 tablet, Rfl: 1   molnupiravir EUA (LAGEVRIO) 200 mg CAPS capsule, Take 4 capsules (800 mg total) by mouth 2 (two) times daily for 5 days., Disp: 40 capsule, Rfl: 0   Multiple Vitamins-Minerals (MULTIVITAMIN WITH MINERALS) tablet, Take 1 tablet by mouth daily., Disp: , Rfl:    Omega-3 Fatty Acids (FISH OIL) 1000 MG CAPS, Take by mouth., Disp: , Rfl:    PSYLLIUM HUSK PO, Take 1,400 mg by mouth daily., Disp: , Rfl:    rosuvastatin (CRESTOR) 5 MG tablet, Take 1 tab 3 times a week., Disp: 30 tablet, Rfl: 3   Semaglutide-Weight Management (WEGOVY) 0.25 MG/0.5ML SOAJ, , Disp: , Rfl:    fluticasone (FLONASE) 50 MCG/ACT nasal spray, Place 1 spray into both nostrils daily as needed for allergies., Disp: 9.9 mL, Rfl: 1  EXAM:  VITALS per patient if applicable:Pulse 60   Ht '5\' 6"'$  (1.676 m)   LMP 07/10/2019 (Exact Date)   SpO2 98%   BMI 39.06 kg/m   GENERAL: alert, oriented, appears well and in no acute distress  HEENT: atraumatic, conjunctiva clear, no obvious  abnormalities on inspection of external nose and ears  NECK: normal movements of the head and neck  LUNGS: on inspection no signs of respiratory distress, breathing rate appears normal, no obvious gross SOB, gasping or wheezing  CV: no obvious cyanosis  MS: moves all visible extremities without noticeable abnormality  PSYCH/NEURO: pleasant and cooperative, no obvious depression or anxiety, speech and thought processing grossly intact  ASSESSMENT AND PLAN:  Discussed the following assessment and plan:  COVID-19 virus infection - Plan: molnupiravir EUA (LAGEVRIO) 200 mg CAPS capsule  Post-nasal drainage - Plan: fluticasone (FLONASE) 50 MCG/ACT nasal spray  We discussed Dx,possible complications and treatment options. She has a mild case with some risk for complications. We discussed oral antiviral options and side effects.  Because of past history of abnormal LFTs, recommend Molnupiravir. Symptomatic treatment with plenty of fluids,rest,tylenol 500 mg 3-4 times per day prn. She has used Flonase nasal spray for postnasal drainage, continue daily prn. Throat lozenges if needed for sore throat. 5 to 7 days of quarantine,states that she does not need a note for work because she works from home. Monitor for new symptoms. Instructed about warning signs.   We discussed possible serious and likely etiologies, options for evaluation and workup, limitations of telemedicine visit vs in person visit, treatment, treatment risks and precautions. The patient was advised to call back or seek an in-person evaluation if the symptoms worsen or if the condition fails to improve as anticipated. I discussed the assessment and treatment plan with the patient. The patient was provided an opportunity to ask questions and all were answered. The patient agreed with the plan and demonstrated an understanding of the instructions.  Return if symptoms worsen or fail to improve.  Allan Bacigalupi G. Martinique, MD  [facility]. [place] office.

## 2021-11-01 ENCOUNTER — Encounter: Payer: Self-pay | Admitting: Internal Medicine

## 2021-11-05 ENCOUNTER — Encounter: Payer: Self-pay | Admitting: Internal Medicine

## 2021-11-09 ENCOUNTER — Encounter: Payer: Self-pay | Admitting: Internal Medicine

## 2021-11-09 ENCOUNTER — Ambulatory Visit (AMBULATORY_SURGERY_CENTER): Payer: BC Managed Care – PPO | Admitting: Internal Medicine

## 2021-11-09 VITALS — BP 114/68 | HR 56 | Temp 97.8°F | Resp 12 | Ht 66.0 in | Wt 242.0 lb

## 2021-11-09 DIAGNOSIS — K635 Polyp of colon: Secondary | ICD-10-CM | POA: Diagnosis not present

## 2021-11-09 DIAGNOSIS — Z1211 Encounter for screening for malignant neoplasm of colon: Secondary | ICD-10-CM

## 2021-11-09 DIAGNOSIS — D123 Benign neoplasm of transverse colon: Secondary | ICD-10-CM

## 2021-11-09 MED ORDER — SODIUM CHLORIDE 0.9 % IV SOLN: 500.0000 mL | Freq: Once | INTRAVENOUS | Status: DC

## 2021-11-09 NOTE — Progress Notes (Signed)
Sedate, gd SR, tolerated procedure well, VSS, report to RN 

## 2021-11-09 NOTE — Progress Notes (Signed)
[facility] History and Physical   Primary Care Physician:  Billie Ruddy, MD   Reason for Procedure:   Colon cancer screening  Plan:    colonoscopy     HPI: Dorothy Taylor is a 51 y.o. female here for a colon cancer screening exam.   Past Medical History:  Diagnosis Date   Allergy    Anxiety    Blood transfusion without reported diagnosis    Depression    Hyperlipidemia    Hypertension     Past Surgical History:  Procedure Laterality Date   ROBOTIC ASSISTED LAPAROSCOPIC HYSTERECTOMY AND SALPINGECTOMY Bilateral 01/05/2021   Procedure: XI ROBOTIC ASSISTED LAPAROSCOPIC HYSTERECTOMY GREATER THAN TWO HUNDRED AND FIFTY GRAMS AND SALPINGECTOMY WITH MINI LAPAROTOMY;  Surgeon: Everitt Amber, MD;  Location: [facility];  Service: Gynecology;  Laterality: Bilateral;    Prior to Admission medications   Medication Sig Start Date End Date Taking? Authorizing Provider  escitalopram (LEXAPRO) 10 MG tablet Take 1 tablet (10 mg total) by mouth daily. 09/30/21  Yes Billie Ruddy, MD  fluticasone (FLONASE) 50 MCG/ACT nasal spray Place 1 spray into both nostrils daily as needed for allergies. 10/29/21  Yes Martinique, Betty G, MD  hydrochlorothiazide (HYDRODIURIL) 12.5 MG tablet Take 1 tablet (12.5 mg total) by mouth daily. 09/30/21  Yes Billie Ruddy, MD  losartan (COZAAR) 25 MG tablet Take 1 tablet (25 mg total) by mouth daily. 09/30/21  Yes Billie Ruddy, MD  Multiple Vitamins-Minerals (MULTIVITAMIN WITH MINERALS) tablet Take 1 tablet by mouth daily.    [provider]  Omega-3 Fatty Acids (FISH OIL) 1000 MG CAPS Take by mouth.    [provider]  PSYLLIUM HUSK PO Take 1,400 mg by mouth daily.    [provider]  rosuvastatin (CRESTOR) 5 MG tablet Take 1 tab 3 times a week. 04/14/21   Billie Ruddy, MD  Semaglutide-Weight Management [facility]) 0.25 MG/0.5ML Darden Palmer  09/02/21   [provider]    Current Outpatient Medications   Medication Sig Dispense Refill   escitalopram (LEXAPRO) 10 MG tablet Take 1 tablet (10 mg total) by mouth daily. 90 tablet 1   fluticasone (FLONASE) 50 MCG/ACT nasal spray Place 1 spray into both nostrils daily as needed for allergies. 9.9 mL 1   hydrochlorothiazide (HYDRODIURIL) 12.5 MG tablet Take 1 tablet (12.5 mg total) by mouth daily. 90 tablet 1   losartan (COZAAR) 25 MG tablet Take 1 tablet (25 mg total) by mouth daily. 90 tablet 1   Multiple Vitamins-Minerals (MULTIVITAMIN WITH MINERALS) tablet Take 1 tablet by mouth daily.     Omega-3 Fatty Acids (FISH OIL) 1000 MG CAPS Take by mouth.     PSYLLIUM HUSK PO Take 1,400 mg by mouth daily.     rosuvastatin (CRESTOR) 5 MG tablet Take 1 tab 3 times a week. 30 tablet 3   Semaglutide-Weight Management (WEGOVY) 0.25 MG/0.5ML SOAJ      Current Facility-Administered Medications  Medication Dose Route Frequency Provider Last Rate Last Admin   0.9 %  sodium chloride infusion  500 mL Intravenous Once Gatha Mayer, MD        Allergies as of 11/09/2021   (No Known Allergies)    Family History  Problem Relation Age of Onset   Colon polyps Mother    Hypertension Mother    Hyperlipidemia Mother    Pancreatic cancer Father    Hyperlipidemia Father    Hypertension Father    Heart attack Father  Depression Father    Cancer Father    Asthma Sister    Depression Sister    Colon cancer Paternal Aunt    Hypertension Maternal Grandmother    Hyperlipidemia Maternal Grandmother    Hypertension Maternal Grandfather    Hyperlipidemia Maternal Grandfather    Heart disease Maternal Grandfather    Heart attack Maternal Grandfather    Esophageal cancer Neg Hx    Rectal cancer Neg Hx    Stomach cancer Neg Hx     Social History   Socioeconomic History   Marital status: Married    Spouse name: Not on file   Number of children: Not on file   Years of education: Not on file   Highest education level: Bachelor's degree (e.g., BA, AB, BS)   Occupational History   Not on file  Tobacco Use   Smoking status: Never   Smokeless tobacco: Never  Vaping Use   Vaping Use: Never used  Substance and Sexual Activity   Alcohol use: Yes    Comment: wine- once  - twice per week   Drug use: Never   Sexual activity: Yes  Other Topics Concern   Not on file  Social History Narrative   Not on file   Social Determinants of Health   Financial Resource Strain: Low Risk  (04/13/2021)   Overall Financial Resource Strain (CARDIA)    Difficulty of Paying Living Expenses: Not hard at all  Food Insecurity: No Food Insecurity (04/13/2021)   Hunger Vital Sign    Worried About Running Out of Food in the Last Year: Never true    Ran Out of Food in the Last Year: Never true  Transportation Needs: No Transportation Needs (04/13/2021)   PRAPARE - Hydrologist (Medical): No    Lack of Transportation (Non-Medical): No  Physical Activity: Insufficiently Active (04/13/2021)   Exercise Vital Sign    Days of Exercise per Week: 3 days    Minutes of Exercise per Session: 40 min  Stress: No Stress Concern Present (04/13/2021)   [place]    Feeling of Stress : Not at all  Social Connections: Moderately Isolated (04/13/2021)   Social Connection and Isolation Panel [NHANES]    Frequency of Communication with Friends and Family: More than three times a week    Frequency of Social Gatherings with Friends and Family: Twice a week    Attends Religious Services: Never    Marine scientist or Organizations: No    Attends Music therapist: Not on file    Marital Status: Married  Human resources officer Violence: Not on file    Review of Systems:  All other review of systems negative except as mentioned in the HPI.  Physical Exam: Vital signs BP (!) 145/94   Pulse 61   Temp 97.8 F (36.6 C)   Ht '5\' 6"'$  (1.676 m)   Wt 242 lb (109.8 kg)   LMP 07/10/2019  (Exact Date)   SpO2 98%   BMI 39.06 kg/m   General:   Alert,  Well-developed, well-nourished, pleasant and cooperative in NAD Lungs:  Clear throughout to auscultation.   Heart:  Regular rate and rhythm; no murmurs, clicks, rubs,  or gallops. Abdomen:  Soft, nontender and nondistended. Normal bowel sounds.   Neuro/Psych:  Alert and cooperative. Normal mood and affect. A and O x 3   '@Diontay Rosencrans'$  Simonne Maffucci, MD, [facility] [phone] (pager) 11/09/2021 10:20  AM@

## 2021-11-09 NOTE — Patient Instructions (Addendum)
I found and removed one tiny polyp that looks benign but may be pre-cancerous.  You also have a condition called diverticulosis - common and not usually a problem. Please read the handout provided.  I will let you know pathology results and when to have another routine colonoscopy by mail and/or My Chart.  I appreciate the opportunity to care for you. Gatha Mayer, MD, [facility]   Information on polyps and diverticulosis given to you today.  Await pathology results.  Resume previous diet and medications.  YOU HAD AN ENDOSCOPIC PROCEDURE TODAY AT [facility]:   Refer to the procedure report that was given to you for any specific questions about what was found during the examination.  If the procedure report does not answer your questions, please call your gastroenterologist to clarify.  If you requested that your care partner not be given the details of your procedure findings, then the procedure report has been included in a sealed envelope for you to review at your convenience later.  YOU SHOULD EXPECT: Some feelings of bloating in the abdomen. Passage of more gas than usual.  Walking can help get rid of the air that was put into your GI tract during the procedure and reduce the bloating. If you had a lower endoscopy (such as a colonoscopy or flexible sigmoidoscopy) you may notice spotting of blood in your stool or on the toilet paper. If you underwent a bowel prep for your procedure, you may not have a normal bowel movement for a few days.  Please Note:  You might notice some irritation and congestion in your nose or some drainage.  This is from the oxygen used during your procedure.  There is no need for concern and it should clear up in a day or so.  SYMPTOMS TO REPORT IMMEDIATELY:  Following lower endoscopy (colonoscopy or flexible sigmoidoscopy):  Excessive amounts of blood in the stool  Significant tenderness or worsening of abdominal pains  Swelling of the abdomen  that is new, acute  Fever of 100F or higher   For urgent or emergent issues, a gastroenterologist can be reached at any hour by calling [phone]. Do not use MyChart messaging for urgent concerns.    DIET:  We do recommend a small meal at first, but then you may proceed to your regular diet.  Drink plenty of fluids but you should avoid alcoholic beverages for 24 hours.  ACTIVITY:  You should plan to take it easy for the rest of today and you should NOT DRIVE or use heavy machinery until tomorrow (because of the sedation medicines used during the test).    FOLLOW UP: Our staff will call the number listed on your records the next business day following your procedure.  We will call around 7:15- 8:00 am to check on you and address any questions or concerns that you may have regarding the information given to you following your procedure. If we do not reach you, we will leave a message.  If you develop any symptoms (ie: fever, flu-like symptoms, shortness of breath, cough etc.) before then, please call [phone].  If you test positive for Covid 19 in the 2 weeks post procedure, please call and report this information to [place].    If any biopsies were taken you will be contacted by phone or by letter within the next 1-3 weeks.  Please call us at [phone] if you have not heard about the biopsies in 3 weeks.  SIGNATURES/CONFIDENTIALITY: You and/or your care partner have signed paperwork which will be entered into your electronic medical record.  These signatures attest to the fact that that the information above on your After Visit Summary has been reviewed and is understood.  Full responsibility of the confidentiality of this discharge information lies with you and/or your care-partner.

## 2021-11-09 NOTE — Progress Notes (Signed)
Pt's states no medical or surgical changes since previsit or office visit. 

## 2021-11-09 NOTE — Progress Notes (Signed)
Called to room to assist during endoscopic procedure.  Patient ID and intended procedure confirmed with present staff. Received instructions for my participation in the procedure from the performing physician.  

## 2021-11-09 NOTE — Op Note (Signed)
[place] Patient Name: Dorothy Taylor Procedure Date: 11/09/2021 10:24 AM MRN: [id number] Endoscopist: Gatha Mayer , MD Age: 51 Referring MD:  Date of Birth: [birth year] Gender: Female Account #: [id number] Procedure:                Colonoscopy Indications:              Screening for colorectal malignant neoplasm, This                            is the patient's first colonoscopy Medicines:                Monitored Anesthesia Care Procedure:                Pre-Anesthesia Assessment:                           - Prior to the procedure, a History and Physical                            was performed, and patient medications and                            allergies were reviewed. The patient's tolerance of                            previous anesthesia was also reviewed. The risks                            and benefits of the procedure and the sedation                            options and risks were discussed with the patient.                            All questions were answered, and informed consent                            was obtained. Prior Anticoagulants: The patient has                            taken no previous anticoagulant or antiplatelet                            agents. ASA Grade Assessment: II - A patient with                            mild systemic disease. After reviewing the risks                            and benefits, the patient was deemed in                            satisfactory condition to undergo the procedure.  After obtaining informed consent, the colonoscope                            was passed under direct vision. Throughout the                            procedure, the patient's blood pressure, pulse, and                            oxygen saturations were monitored continuously. The                            CF [id number] #[id number] was introduced through the anus                            and advanced to  the the cecum, identified by                            appendiceal orifice and ileocecal valve. The                            colonoscopy was performed without difficulty. The                            patient tolerated the procedure well. The quality                            of the bowel preparation was good. The bowel                            preparation used was Miralax via split dose                            instruction. The ileocecal valve, appendiceal                            orifice, and rectum were photographed. Scope In: 10:37:19 AM Scope Out: 10:51:14 AM Scope Withdrawal Time: 0 hours 10 minutes 38 seconds  Total Procedure Duration: 0 hours 13 minutes 55 seconds  Findings:                 The perianal and digital rectal examinations were                            normal.                           A diminutive polyp was found in the proximal                            transverse colon. The polyp was sessile. The polyp                            was removed with a cold snare. Resection and  retrieval were complete. Verification of patient                            identification for the specimen was done. Estimated                            blood loss was minimal.                           Multiple diverticula were found in the sigmoid                            colon and cecum.                           The exam was otherwise without abnormality on                            direct and retroflexion views. Complications:            No immediate complications. Estimated Blood Loss:     Estimated blood loss was minimal. Impression:               - One diminutive polyp in the proximal transverse                            colon, removed with a cold snare. Resected and                            retrieved.                           - Diverticulosis in the sigmoid colon and in the                            cecum.                           -  The examination was otherwise normal on direct                            and retroflexion views. Recommendation:           - Patient has a contact number available for                            emergencies. The signs and symptoms of potential                            delayed complications were discussed with the                            patient. Return to normal activities tomorrow.                            Written discharge instructions were provided to the  patient.                           - Resume previous diet.                           - Continue present medications.                           - Repeat colonoscopy is recommended. The                            colonoscopy date will be determined after pathology                            results from today's exam become available for                            review. Gatha Mayer, MD 11/09/2021 10:58:18 AM This report has been signed electronically.

## 2021-11-10 ENCOUNTER — Telehealth: Payer: Self-pay | Admitting: *Deleted

## 2021-11-10 NOTE — Telephone Encounter (Signed)
  Follow up Call-     11/09/2021    9:39 AM  Call back number  Post procedure Call Back phone  # [phone]  Permission to leave phone message Yes     Patient questions:  Do you have a fever, pain , or abdominal swelling? No. Pain Score  0 *  Have you tolerated food without any problems? Yes.    Have you been able to return to your normal activities? Yes.    Do you have any questions about your discharge instructions: Diet   No. Medications  No. Follow up visit  No.  Do you have questions or concerns about your Care? No.  Actions: * If pain score is 4 or above: No action needed, pain <4.

## 2021-11-16 ENCOUNTER — Encounter: Payer: Self-pay | Admitting: Internal Medicine

## 2021-11-16 DIAGNOSIS — Z860100 Personal history of colon polyps, unspecified: Secondary | ICD-10-CM | POA: Insufficient documentation

## 2021-11-20 ENCOUNTER — Other Ambulatory Visit: Payer: Self-pay | Admitting: Family Medicine

## 2021-11-20 DIAGNOSIS — R0982 Postnasal drip: Secondary | ICD-10-CM

## 2021-11-29 ENCOUNTER — Other Ambulatory Visit: Payer: Self-pay

## 2021-11-29 DIAGNOSIS — F339 Major depressive disorder, recurrent, unspecified: Secondary | ICD-10-CM

## 2021-11-29 DIAGNOSIS — I10 Essential (primary) hypertension: Secondary | ICD-10-CM

## 2021-11-29 MED ORDER — ESCITALOPRAM OXALATE 10 MG PO TABS: 10.0000 mg | ORAL_TABLET | Freq: Every day | ORAL | 1 refills | Status: DC

## 2021-11-29 MED ORDER — LOSARTAN POTASSIUM 25 MG PO TABS: 25.0000 mg | ORAL_TABLET | Freq: Every day | ORAL | 1 refills | Status: DC

## 2021-11-29 MED ORDER — HYDROCHLOROTHIAZIDE 12.5 MG PO TABS: 12.5000 mg | ORAL_TABLET | Freq: Every day | ORAL | 1 refills | Status: DC

## 2021-11-30 LAB — HM MAMMOGRAPHY

## 2021-12-01 ENCOUNTER — Encounter: Payer: Self-pay | Admitting: Family Medicine

## 2021-12-21 ENCOUNTER — Other Ambulatory Visit: Payer: Self-pay | Admitting: Family Medicine

## 2021-12-21 DIAGNOSIS — R0982 Postnasal drip: Secondary | ICD-10-CM

## 2022-01-05 ENCOUNTER — Other Ambulatory Visit: Payer: Self-pay | Admitting: Family Medicine

## 2022-01-05 DIAGNOSIS — R0982 Postnasal drip: Secondary | ICD-10-CM

## 2022-01-31 ENCOUNTER — Ambulatory Visit (INDEPENDENT_AMBULATORY_CARE_PROVIDER_SITE_OTHER): Payer: BC Managed Care – PPO | Admitting: Family Medicine

## 2022-01-31 ENCOUNTER — Encounter: Payer: Self-pay | Admitting: Family Medicine

## 2022-01-31 VITALS — BP 128/72 | HR 68 | Temp 97.7°F | Ht 66.0 in | Wt 239.4 lb

## 2022-01-31 DIAGNOSIS — I10 Essential (primary) hypertension: Secondary | ICD-10-CM | POA: Diagnosis not present

## 2022-01-31 DIAGNOSIS — Z23 Encounter for immunization: Secondary | ICD-10-CM

## 2022-01-31 DIAGNOSIS — T466X5A Adverse effect of antihyperlipidemic and antiarteriosclerotic drugs, initial encounter: Secondary | ICD-10-CM | POA: Diagnosis not present

## 2022-01-31 DIAGNOSIS — Z0000 Encounter for general adult medical examination without abnormal findings: Secondary | ICD-10-CM

## 2022-01-31 DIAGNOSIS — M791 Myalgia, unspecified site: Secondary | ICD-10-CM

## 2022-01-31 DIAGNOSIS — I700 Atherosclerosis of aorta: Secondary | ICD-10-CM

## 2022-01-31 DIAGNOSIS — Z6838 Body mass index (BMI) 38.0-38.9, adult: Secondary | ICD-10-CM

## 2022-01-31 DIAGNOSIS — E782 Mixed hyperlipidemia: Secondary | ICD-10-CM

## 2022-01-31 LAB — COMPREHENSIVE METABOLIC PANEL
ALT: 20 U/L (ref 0–35)
AST: 18 U/L (ref 0–37)
Albumin: 4.4 g/dL (ref 3.5–5.2)
Alkaline Phosphatase: 64 U/L (ref 39–117)
BUN: 13 mg/dL (ref 6–23)
CO2: 28 mEq/L (ref 19–32)
Calcium: 9.7 mg/dL (ref 8.4–10.5)
Chloride: 105 mEq/L (ref 96–112)
Creatinine, Ser: 0.71 mg/dL (ref 0.40–1.20)
GFR: 99.4 mL/min (ref >60.00)
Glucose, Bld: 93 mg/dL (ref 70–99)
Potassium: 4.6 mEq/L (ref 3.5–5.1)
Sodium: 141 mEq/L (ref 135–145)
Total Bilirubin: 0.6 mg/dL (ref 0.2–1.2)
Total Protein: 7.6 g/dL (ref 6.0–8.3)

## 2022-01-31 LAB — CBC WITH DIFFERENTIAL/PLATELET
Basophils Absolute: 0 10*3/uL (ref 0.0–0.1)
Basophils Relative: 0.6 % (ref 0.0–3.0)
Eosinophils Absolute: 0.1 10*3/uL (ref 0.0–0.7)
Eosinophils Relative: 2.1 % (ref 0.0–5.0)
HCT: 42.6 % (ref 36.0–46.0)
Hemoglobin: 14.4 g/dL (ref 12.0–15.0)
Lymphocytes Relative: 34.7 % (ref 12.0–46.0)
Lymphs Abs: 2.3 10*3/uL (ref 0.7–4.0)
MCHC: 33.8 g/dL (ref 30.0–36.0)
MCV: 90.3 fl (ref 78.0–100.0)
Monocytes Absolute: 0.4 10*3/uL (ref 0.1–1.0)
Monocytes Relative: 5.6 % (ref 3.0–12.0)
Neutro Abs: 3.8 10*3/uL (ref 1.4–7.7)
Neutrophils Relative %: 57 % (ref 43.0–77.0)
Platelets: 265 10*3/uL (ref 150.0–400.0)
RBC: 4.72 Mil/uL (ref 3.87–5.11)
RDW: 13.2 % (ref 11.5–15.5)
WBC: 6.6 10*3/uL (ref 4.0–10.5)

## 2022-01-31 LAB — LIPID PANEL
Cholesterol: 253 mg/dL — ABNORMAL HIGH (ref 0–200)
HDL: 49.4 mg/dL (ref >39.00)
LDL Cholesterol: 175 mg/dL — ABNORMAL HIGH (ref 0–99)
NonHDL: 203.3
Total CHOL/HDL Ratio: 5
Triglycerides: 143 mg/dL (ref 0.0–149.0)
VLDL: 28.6 mg/dL (ref 0.0–40.0)

## 2022-01-31 LAB — T4, FREE: Free T4: 0.88 ng/dL (ref 0.60–1.60)

## 2022-01-31 LAB — HEMOGLOBIN A1C: Hgb A1c MFr Bld: 5.2 % (ref 4.6–6.5)

## 2022-01-31 LAB — VITAMIN D 25 HYDROXY (VIT D DEFICIENCY, FRACTURES): VITD: 23.49 ng/mL — ABNORMAL LOW (ref 30.00–100.00)

## 2022-01-31 LAB — TSH: TSH: 1.92 u[IU]/mL (ref 0.35–5.50)

## 2022-01-31 NOTE — Progress Notes (Signed)
Subjective:     Dorothy Taylor is a 51 y.o. female and is here for a comprehensive physical exam.  Patient states she has been doing well overall.  Currently taking Crestor 5 mg twice a week in May 2/2 myalgias/joint pain.  Patient notes improvement in symptoms.  On third month of taking Wegovy.  Has lost 15 pounds.  Unsure if that is also helped with joint symptoms.  States blood pressure has been stable.  Has yet to take BP meds this morning and reading was 128/72.  Patient endorses feeling somewhat tired this morning as her dog woke up early this morning with emesis.  Social History   Socioeconomic History   Marital status: Married    Spouse name: Not on file   Number of children: Not on file   Years of education: Not on file   Highest education level: Bachelor's degree (e.g., BA, AB, BS)  Occupational History   Not on file  Tobacco Use   Smoking status: Never   Smokeless tobacco: Never  Vaping Use   Vaping Use: Never used  Substance and Sexual Activity   Alcohol use: Yes    Comment: wine- once  - twice per week   Drug use: Never   Sexual activity: Yes  Other Topics Concern   Not on file  Social History Narrative   Not on file   Social Determinants of Health   Financial Resource Strain: Low Risk  (04/13/2021)   Overall Financial Resource Strain (CARDIA)    Difficulty of Paying Living Expenses: Not hard at all  Food Insecurity: No Food Insecurity (04/13/2021)   Hunger Vital Sign    Worried About Running Out of Food in the Last Year: Never true    Ran Out of Food in the Last Year: Never true  Transportation Needs: No Transportation Needs (04/13/2021)   PRAPARE - Hydrologist (Medical): No    Lack of Transportation (Non-Medical): No  Physical Activity: Insufficiently Active (04/13/2021)   Exercise Vital Sign    Days of Exercise per Week: 3 days    Minutes of Exercise per Session: 40 min  Stress: No Stress Concern Present (04/13/2021)    [place]    Feeling of Stress : Not at all  Social Connections: Moderately Isolated (04/13/2021)   Social Connection and Isolation Panel [NHANES]    Frequency of Communication with Friends and Family: More than three times a week    Frequency of Social Gatherings with Friends and Family: Twice a week    Attends Religious Services: Never    Marine scientist or Organizations: No    Attends Music therapist: Not on file    Marital Status: Married  Human resources officer Violence: Not on file   Health Maintenance  Topic Date Due   HIV Screening  Never done   Hepatitis C Screening  Never done   COVID-19 Vaccine (5 - Pfizer series) 03/06/2021   TETANUS/TDAP  09/23/2021   INFLUENZA VACCINE  11/09/2021   Zoster Vaccines- Shingrix (1 of 2) Never done   PAP SMEAR-Modifier  10/10/2023   MAMMOGRAM  12/01/2023   COLONOSCOPY (Pts 45-72yr Insurance coverage will need to be confirmed)  11/09/2028   HPV VACCINES  Aged Out    The following portions of the patient's history were reviewed and updated as appropriate: allergies, current medications, past family history, past medical history, past social history, past surgical history, and  problem list.  Review of Systems Pertinent items noted in HPI and remainder of comprehensive ROS otherwise negative.   Objective:    BP 128/72 (BP Location: Left Arm, Patient Position: Sitting, Cuff Size: Large)   Pulse 68   Temp 97.7 F (36.5 C) (Oral)   Ht '5\' 6"'$  (1.676 m)   Wt 239 lb 6.4 oz (108.6 kg)   LMP 07/10/2019 (Exact Date)   SpO2 99%   BMI 38.64 kg/m  General appearance: alert, cooperative, and no distress Head: Normocephalic, without obvious abnormality, atraumatic Eyes: conjunctivae/corneas clear. PERRL, EOM's intact. Fundi benign. Ears: normal TM's and external ear canals both ears Nose: Nares normal. Septum midline. Mucosa normal. No drainage or sinus  tenderness. Throat: lips, mucosa, and tongue normal; teeth and gums normal Neck: no adenopathy, no carotid bruit, no JVD, supple, symmetrical, trachea midline, and thyroid not enlarged, symmetric, no tenderness/mass/nodules Lungs: clear to auscultation bilaterally Heart: regular rate and rhythm, S1, S2 normal, no murmur, click, rub or gallop Abdomen: soft, non-tender; bowel sounds normal; no masses,  no organomegaly Extremities: extremities normal, atraumatic, no cyanosis or edema Pulses: 2+ and symmetric Skin: Skin color, texture, turgor normal. No rashes or lesions Lymph nodes: Cervical, supraclavicular, and axillary nodes normal. Neurologic: Alert and oriented X 3, normal strength and tone. Normal symmetric reflexes. Normal coordination and gait    Assessment:    Healthy female exam.      Plan:    Anticipatory guidance given including wearing seatbelts, smoke detectors in the home, increasing physical activity, increasing p.o. intake of water and vegetables. -labs -Mammogram done 11/30/2021 -Colonoscopy done 11/09/2021 -Pap done 10/09/2020 -Immunizations reviewed.  Influenza vaccine given this visit.  Patient to obtain COVID-vaccine at local pharmacy as clinic does not have available. -Next CPE in 1 year -Declined AVS See After Visit Summary for Counseling Recommendations   Essential hypertension -Controlled -Continue current medications including hydrochlorothiazide 12.5 mg and losartan 25 mg daily. -We will likely be able to reduce BP medication with continued weight loss. - Plan: CBC with Differential/Platelet, TSH, T4, Free, CMP  Need for immunization against influenza - Plan: Flu Vaccine QUAD 6+ mos PF IM (Fluarix Quad PF)  Myalgia due to statin -Crestor 5 mg discontinued with improvement in joint and muscle pain. - Plan: Lipid panel  Mixed hyperlipidemia -Total cholesterol 274, HDL 56.4, LDL 188, triglycerides 151 on 03/17/2021 -Crestor 5 mg d/c'd 2/2 myalgias -Further  recommendations based on lipid panel this visit - Plan: Lipid panel, CMP  Class 2 severe obesity due to excess calories with serious comorbidity and body mass index (BMI) of 38.0 to 38.9 in adult [facility])  -Body mass index is 38.64 kg/m. -Congratulated on 15 pound weight loss -Continue [employer] -Continue lifestyle modifications - Plan: TSH, T4, Free, Hemoglobin A1c, CMP, Vitamin D, 25-hydroxy  Atherosclerosis of aorta -As noted on imaging -Statin discontinued 2/2 myalgias -Lifestyle modifications  F/u prn  Grier Mitts, MD

## 2022-02-04 ENCOUNTER — Other Ambulatory Visit: Payer: Self-pay

## 2022-02-04 DIAGNOSIS — E782 Mixed hyperlipidemia: Secondary | ICD-10-CM

## 2022-02-04 NOTE — Addendum Note (Signed)
Addended by: Anderson Malta on: 02/04/2022 03:27 PM   Modules accepted: Orders

## 2022-03-25 ENCOUNTER — Ambulatory Visit (INDEPENDENT_AMBULATORY_CARE_PROVIDER_SITE_OTHER): Payer: BC Managed Care – PPO | Admitting: Family Medicine

## 2022-03-25 VITALS — BP 104/78 | HR 67 | Temp 98.8°F | Wt 231.0 lb

## 2022-03-25 DIAGNOSIS — E782 Mixed hyperlipidemia: Secondary | ICD-10-CM | POA: Diagnosis not present

## 2022-03-25 DIAGNOSIS — I499 Cardiac arrhythmia, unspecified: Secondary | ICD-10-CM | POA: Diagnosis not present

## 2022-03-25 NOTE — Progress Notes (Signed)
Subjective:    Patient ID: Dorothy Taylor, female    DOB: 1970/08/08, 51 y.o.   MRN: [id number]  Chief Complaint  Patient presents with   Irregular Heart Beat    Pt reports she got A-fib alert from apple watch 2 wks. During that time, she feels her heart is beating fast.     HPI Patient was seen today for aute concern.  Pt's apple watch notified her of afib twice in a 3 hr period 2 wks ago.  Pt could feel her heart racing.    Endorses decreased fluid intake that day.  HR has been 46-151.  Denies SOB, headaches, CP, dizziness, syncope.  Intolerance to crestor.  Caused joint pain.  LDL 175, total cholesterol 253 on 01/31/2022.  Patient endorses feet being cold at night when getting in bed.  Past Medical History:  Diagnosis Date   Allergy    Anxiety    Blood transfusion without reported diagnosis    Depression    Hx of colonic polyp 11/16/2021   Hyperlipidemia    Hypertension     No Known Allergies  ROS General: Denies fever, chills, night sweats, changes in weight, changes in appetite HEENT: Denies headaches, ear pain, changes in vision, rhinorrhea, sore throat CV: Denies CP, palpitations, SOB, orthopnea + tachycardia, irregular heartbeat Pulm: Denies SOB, cough, wheezing GI: Denies abdominal pain, nausea, vomiting, diarrhea, constipation GU: Denies dysuria, hematuria, frequency, vaginal discharge Msk: Denies muscle cramps, joint pains + cold feet Neuro: Denies weakness, numbness, tingling Skin: Denies rashes, bruising Psych: Denies depression, anxiety, hallucinations     Objective:    Blood pressure 104/78, pulse 67, temperature 98.8 F (37.1 C), temperature source Oral, weight 231 lb (104.8 kg), last menstrual period 07/10/2019, SpO2 95 %.  Gen. Pleasant, well-nourished, in no distress, normal affect   HEENT: /AT, face symmetric, conjunctiva clear, no scleral icterus, PERRLA, EOMI, nares patent without drainage, Neck: No JVD, no thyromegaly, no carotid  bruits Lungs: no accessory muscle use, CTAB, no wheezes or rales Cardiovascular: RRR, no m/r/g, no peripheral edema Musculoskeletal: No deformities, no cyanosis or clubbing, normal tone Neuro:  A&Ox3, CN II-XII intact, normal gait  Wt Readings from Last 3 Encounters:  01/31/22 239 lb 6.4 oz (108.6 kg)  11/09/21 242 lb (109.8 kg)  10/27/21 242 lb (109.8 kg)    Lab Results  Component Value Date   WBC 6.6 01/31/2022   HGB 14.4 01/31/2022   HCT 42.6 01/31/2022   PLT 265.0 01/31/2022   GLUCOSE 93 01/31/2022   CHOL 253 (H) 01/31/2022   TRIG 143.0 01/31/2022   HDL 49.40 01/31/2022   LDLDIRECT 140.0 03/15/2019   LDLCALC 175 (H) 01/31/2022   ALT 20 01/31/2022   AST 18 01/31/2022   NA 141 01/31/2022   K 4.6 01/31/2022   CL 105 01/31/2022   CREATININE 0.71 01/31/2022   BUN 13 01/31/2022   CO2 28 01/31/2022   TSH 1.92 01/31/2022   INR 0.9 01/05/2021   HGBA1C 5.2 01/31/2022    Assessment/Plan:  Irregular heart beat - Plan: EKG 12-Lead, Ambulatory referral to Cardiology  Mixed hyperlipidemia  Recent episode of A-fib noted on patient's watch.  EKG this visit with bradycardia.  Nonspecific T wave inversion in aVR, aVL, V1-V5.  Similar to EKG from 12/23/2020.  Referral to cardiology placed.  Patient given strict precautions for continued or worsening symptoms.    Discussed starting niacin or fish oil tablets versus trying a different statin for cholesterol elevation.  F/u prn.  Larene Beach  Volanda Napoleon, MD

## 2022-03-25 NOTE — Patient Instructions (Signed)
Referral sent to cardiologist was placed.  You should expect a phone call about scheduling this appointment  Niacin [phone] mg per day can help with cholesterol in addition to the fish oil or Krill oil capsules 1000 mg daily.

## 2022-04-08 ENCOUNTER — Other Ambulatory Visit: Payer: Self-pay | Admitting: Family Medicine

## 2022-04-08 DIAGNOSIS — F339 Major depressive disorder, recurrent, unspecified: Secondary | ICD-10-CM

## 2022-04-08 DIAGNOSIS — I10 Essential (primary) hypertension: Secondary | ICD-10-CM

## 2022-04-17 ENCOUNTER — Encounter: Payer: Self-pay | Admitting: Family Medicine

## 2022-04-18 MED ORDER — VITAMIN D (ERGOCALCIFEROL) 1.25 MG (50000 UNIT) PO CAPS: 50000.0000 [IU] | ORAL_CAPSULE | ORAL | 0 refills | Status: DC

## 2022-04-19 ENCOUNTER — Encounter: Payer: Self-pay | Admitting: Family Medicine

## 2022-04-26 ENCOUNTER — Encounter: Payer: Self-pay | Admitting: Cardiology

## 2022-04-26 ENCOUNTER — Ambulatory Visit: Payer: BC Managed Care – PPO | Attending: Cardiology | Admitting: Cardiology

## 2022-04-26 VITALS — BP 126/88 | HR 61 | Ht 66.0 in | Wt 227.0 lb

## 2022-04-26 DIAGNOSIS — R9431 Abnormal electrocardiogram [ECG] [EKG]: Secondary | ICD-10-CM | POA: Diagnosis not present

## 2022-04-26 DIAGNOSIS — I499 Cardiac arrhythmia, unspecified: Secondary | ICD-10-CM | POA: Diagnosis not present

## 2022-04-26 DIAGNOSIS — Z8249 Family history of ischemic heart disease and other diseases of the circulatory system: Secondary | ICD-10-CM | POA: Diagnosis not present

## 2022-04-26 DIAGNOSIS — E7800 Pure hypercholesterolemia, unspecified: Secondary | ICD-10-CM | POA: Diagnosis not present

## 2022-04-26 NOTE — Patient Instructions (Signed)
Medication Instructions:  The current medical regimen is effective;  continue present plan and medications.  *If you need a refill on your cardiac medications before your next appointment, please call your pharmacy*  Testing/Procedures: Your physician has requested that you have an echocardiogram. Echocardiography is a painless test that uses sound waves to create images of your heart. It provides your doctor with information about the size and shape of your heart and how well your heart's chambers and valves are working. This procedure takes approximately one hour. There are no restrictions for this procedure. Please do NOT wear cologne, perfume, aftershave, or lotions (deodorant is allowed). Please arrive 15 minutes prior to your appointment time.  Your physician has requested that you have a Coronary Calcium score which is completed by CT. Cardiac computed tomography (CT) is a painless test that uses an x-ray machine to take clear, detailed pictures of your heart. There are no instructions for this testing.  You may eat/drink and take your normal medications this day.  The cost of the testing is $99 due at the time of your appointment.  Follow-Up: At [facility], you and your health needs are our priority.  As part of our continuing mission to provide you with exceptional heart care, we have created designated Provider Care Teams.  These Care Teams include your primary Cardiologist (physician) and Advanced Practice Providers (APPs -  Physician Assistants and Nurse Practitioners) who all work together to provide you with the care you need, when you need it.  We recommend signing up for the patient portal called "MyChart".  Sign up information is provided on this After Visit Summary.  MyChart is used to connect with patients for Virtual Visits (Telemedicine).  Patients are able to view lab/test results, encounter notes, upcoming appointments, etc.  Non-urgent messages can be sent to your  provider as well.   To learn more about what you can do with MyChart, go to [email or web address].    Your next appointment:   1 year(s)  Provider:    Dr Candee Furbish

## 2022-04-26 NOTE — Progress Notes (Signed)
Cardiology Office Note:    Date:  04/26/2022   ID:  Dorothy Taylor, DOB 02-Jun-1970, MRN [id number]  PCP:  Billie Ruddy, MD   [facility] Providers Cardiologist:  None     Referring MD: Billie Ruddy, MD    History of Present Illness:    Dorothy Taylor is a 52 y.o. female here for the evaluation of irregular heartbeat at the request of Dr. Volanda Napoleon.  She had a report from her Apple Watch stating that she had atrial fibrillation.  At that time she felt as though her heart was beating quickly.  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 3 hours of racing. Heart rate is ranged from 46-151.  She denied any syncope chest pain headaches dizziness.    Has had palps for quite some time. Saw Cardiology in [place] after 2001.   [place]-lost 25 to 30 pounds.  Colder feet occasional tingle at the end of the day.   2022 hysterectomy - fibroids. Had joint pain after. Crestor made worse. Wegovy made better.   No TOB Step dad had AFIB. Father had CAD LDL has been 175.  Tried Crestor but caused joint pain.  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  She has not described any shortness of breath chest pain bleeding fevers chills.  Past Medical History:  Diagnosis Date   Allergy    Anxiety    Blood transfusion without reported diagnosis    Depression    Hx of colonic polyp 11/16/2021   Hyperlipidemia    Hypertension     Past Surgical History:  Procedure Laterality Date   ROBOTIC ASSISTED LAPAROSCOPIC HYSTERECTOMY AND SALPINGECTOMY Bilateral 01/05/2021   Procedure: XI ROBOTIC ASSISTED LAPAROSCOPIC HYSTERECTOMY GREATER THAN TWO HUNDRED AND FIFTY GRAMS AND SALPINGECTOMY WITH MINI LAPAROTOMY;  Surgeon: Everitt Amber, MD;  Location: [facility];  Service: Gynecology;  Laterality: Bilateral;    Current Medications: Current Meds  Medication Sig   escitalopram (LEXAPRO)  10 MG tablet TAKE 1 TABLET DAILY   fluticasone (FLONASE) 50 MCG/ACT nasal spray PLACE 1 SPRAY INTO BOTH NOSTRILS DAILY AS NEEDED FOR ALLERGIES.   hydrochlorothiazide (HYDRODIURIL) 12.5 MG tablet TAKE 1 TABLET DAILY   losartan (COZAAR) 25 MG tablet TAKE 1 TABLET DAILY   Multiple Vitamins-Minerals (MULTIVITAMIN WITH MINERALS) tablet Take 1 tablet by mouth daily.   Omega-3 Fatty Acids (FISH OIL) 1000 MG CAPS Take by mouth.   PSYLLIUM HUSK PO Take 1,400 mg by mouth daily.   Semaglutide-Weight Management (WEGOVY) 0.25 MG/0.5ML SOAJ Inject 1 mg into the skin once a week. Pt reports 1.'7mg'$  injection once a week.   Vitamin D, Ergocalciferol, (DRISDOL) 1.25 MG (50000 UNIT) CAPS capsule Take 1 capsule (50,000 Units total) by mouth every 7 (seven) days.     Allergies:   Crestor [rosuvastatin]   Social History   Socioeconomic History   Marital status: Married    Spouse name: Not on file   Number of children: Not on file   Years of education: Not on file   Highest education level: Bachelor's degree (e.g., BA, AB, BS)  Occupational History   Not on file  Tobacco Use   Smoking status: Never   Smokeless tobacco: Never  Vaping Use   Vaping Use: Never used  Substance and Sexual Activity   Alcohol use: Yes    Comment: wine- once  - twice per week   Drug use:  Never   Sexual activity: Yes  Other Topics Concern   Not on file  Social History Narrative   Not on file   Social Determinants of Health   Financial Resource Strain: Low Risk  (04/13/2021)   Overall Financial Resource Strain (CARDIA)    Difficulty of Paying Living Expenses: Not hard at all  Food Insecurity: No Food Insecurity (04/13/2021)   Hunger Vital Sign    Worried About Running Out of Food in the Last Year: Never true    Ran Out of Food in the Last Year: Never true  Transportation Needs: No Transportation Needs (04/13/2021)   PRAPARE - Hydrologist (Medical): No    Lack of Transportation (Non-Medical): No   Physical Activity: Insufficiently Active (04/13/2021)   Exercise Vital Sign    Days of Exercise per Week: 3 days    Minutes of Exercise per Session: 40 min  Stress: No Stress Concern Present (04/13/2021)   Experiment    Feeling of Stress : Not at all  Social Connections: Moderately Isolated (04/13/2021)   Social Connection and Isolation Panel [NHANES]    Frequency of Communication with Friends and Family: More than three times a week    Frequency of Social Gatherings with Friends and Family: Twice a week    Attends Religious Services: Never    Marine scientist or Organizations: No    Attends Music therapist: Not on file    Marital Status: Married     Family History: The patient's family history includes Asthma in her sister; Cancer in her father; Colon cancer in her paternal aunt; Colon polyps in her mother; Depression in her father and sister; Heart attack in her father and maternal grandfather; Heart disease in her maternal grandfather; Hyperlipidemia in her father, maternal grandfather, maternal grandmother, and mother; Hypertension in her father, maternal grandfather, maternal grandmother, and mother; Pancreatic cancer in her father. There is no history of Esophageal cancer, Rectal cancer, or Stomach cancer.  ROS:   Please see the history of present illness.     All other systems reviewed and are negative.  EKGs/Labs/Other Studies Reviewed:    The following studies were reviewed today: Carotid Dopplers 04/21/2021: Trace plaque noted.  CT scan 11/24/2020-aortic atherosclerosis noted  EKG:  The ekg ordered today demonstrates  04/26/2022-normal sinus rhythm 61 nonspecific ST-T wave changes. December 23, 2020-sinus rhythm nonspecific ST-T wave changes, normal intervals.  Recent Labs: 01/31/2022: ALT 20; BUN 13; Creatinine, Ser 0.71; Hemoglobin 14.4; Platelets 265.0; Potassium 4.6; Sodium 141; TSH 1.92   Recent Lipid Panel    Component Value Date/Time   CHOL 253 (H) 01/31/2022 0930   TRIG 143.0 01/31/2022 0930   HDL 49.40 01/31/2022 0930   CHOLHDL 5 01/31/2022 0930   VLDL 28.6 01/31/2022 0930   LDLCALC 175 (H) 01/31/2022 0930   LDLCALC 108 (H) 03/16/2020 0834   LDLDIRECT 140.0 03/15/2019 0926     Risk Assessment/Calculations:               Physical Exam:    VS:  BP 126/88   Pulse 61   Ht '5\' 6"'$  (1.676 m)   Wt 227 lb (103 kg)   LMP 07/10/2019 (Exact Date)   SpO2 95%   BMI 36.64 kg/m     Wt Readings from Last 3 Encounters:  04/26/22 227 lb (103 kg)  03/25/22 231 lb (104.8 kg)  01/31/22 239 lb 6.4 oz (108.6 kg)  GEN:  Well nourished, well developed in no acute distress HEENT: Normal NECK: No JVD; No carotid bruits LYMPHATICS: No lymphadenopathy CARDIAC: RRR, no murmurs, rubs, gallops RESPIRATORY:  Clear to auscultation without rales, wheezing or rhonchi  ABDOMEN: Soft, non-tender, non-distended MUSCULOSKELETAL:  No edema; No deformity  SKIN: Warm and dry NEUROLOGIC:  Alert and oriented x 3 PSYCHIATRIC:  Normal affect   ASSESSMENT:    1. Irregular heart beat   2. Abnormal electrocardiogram   3. Family history of early CAD   44. Pure hypercholesterolemia    PLAN:    In order of problems listed above:  Palpitations, racing heartbeat, abnormal EKG - Apple Watch detected atrial fibrillation, abnormal rhythm.  We will go ahead and check an echocardiogram to ensure proper structure and function of her heart.  Prior TSH in October was normal at 1.9.  She is watching caffeine.  Daily exercise.  Has lost 25 to 30 pounds on Wegovy.  Has nonspecific ST-T wave changes on ECG.  Should be benign.  Family history of CAD/hyperlipidemia LDL 175/statin intolerance with Crestor - We will check a coronary calcium score, $99.  If coronary plaque is present, we will likely refer her to our lipid clinic to discuss further secondary prevention strategies.           Medication Adjustments/Labs and Tests Ordered: Current medicines are reviewed at length with the patient today.  Concerns regarding medicines are outlined above.  Orders Placed This Encounter  Procedures   CT CARDIAC SCORING (SELF PAY ONLY)   EKG 12-Lead   ECHOCARDIOGRAM COMPLETE   No orders of the defined types were placed in this encounter.   Patient Instructions  Medication Instructions:  The current medical regimen is effective;  continue present plan and medications.  *If you need a refill on your cardiac medications before your next appointment, please call your pharmacy*  Testing/Procedures: Your physician has requested that you have an echocardiogram. Echocardiography is a painless test that uses sound waves to create images of your heart. It provides your doctor with information about the size and shape of your heart and how well your heart's chambers and valves are working. This procedure takes approximately one hour. There are no restrictions for this procedure. Please do NOT wear cologne, perfume, aftershave, or lotions (deodorant is allowed). Please arrive 15 minutes prior to your appointment time.  Your physician has requested that you have a Coronary Calcium score which is completed by CT. Cardiac computed tomography (CT) is a painless test that uses an x-ray machine to take clear, detailed pictures of your heart. There are no instructions for this testing.  You may eat/drink and take your normal medications this day.  The cost of the testing is $99 due at the time of your appointment.  Follow-Up: At [facility], you and your health needs are our priority.  As part of our continuing mission to provide you with exceptional heart care, we have created designated Provider Care Teams.  These Care Teams include your primary Cardiologist (physician) and Advanced Practice Providers (APPs -  Physician Assistants and Nurse Practitioners) who all work together to provide  you with the care you need, when you need it.  We recommend signing up for the patient portal called "MyChart".  Sign up information is provided on this After Visit Summary.  MyChart is used to connect with patients for Virtual Visits (Telemedicine).  Patients are able to view lab/test results, encounter notes, upcoming appointments, etc.  Non-urgent messages can  be sent to your provider as well.   To learn more about what you can do with MyChart, go to [email or web address].    Your next appointment:   1 year(s)  Provider:    Dr Candee Furbish          Signed, Candee Furbish, MD  04/26/2022 10:34 AM    [place]

## 2022-05-17 ENCOUNTER — Telehealth: Payer: Self-pay

## 2022-05-17 NOTE — Patient Instructions (Signed)
Visit Information  Thank you for taking time to visit with me today. Please don't hesitate to contact me if I can be of assistance to you.   Following are the goals we discussed today:   Goals Addressed             This Visit's Progress    COMPLETED: Care Coordination Activities-No follow up required       Patient Goals/Self Care Activities: -Calls provider office for new concerns, questions, or BP outside discussed parameters  Interventions Today    Flowsheet Row Most Recent Value  Chronic Disease Discussed/Reviewed   Chronic disease discussed/reviewed during today's visit Hypertension (HTN)  General Interventions   General Interventions Discussed/Reviewed Doctor Visits, Health Screening  Doctor Visits Discussed/Reviewed Doctor Visits Discussed, Annual Wellness Visits  Health Screening Mammogram               If you are experiencing a Mental Health or [place] or need someone to talk to, please call the Suicide and Crisis Lifeline: 988   Patient verbalizes understanding of instructions and care plan provided today and agrees to view in [facility]. Active MyChart status and patient understanding of how to access instructions and care plan via MyChart confirmed with patient.     No further follow up required: decline  Jone Baseman, RN, MSN [place] Management Care Management Coordinator Direct Line [phone]

## 2022-05-17 NOTE — Patient Outreach (Signed)
  Care Coordination   Initial Visit Note   05/17/2022 Name: Jenice Leiner [facility] MRN: [id number] DOB: [birth year]  Lars Masson Dilger is a 52 y.o. year old female who sees Billie Ruddy, MD for primary care. I spoke with  Lars Masson Amparo by phone today.  What matters to the patients health and wellness today?  none    Goals Addressed             This Visit's Progress    Care Coordination Activities-No follow up required       Patient Goals/Self Care Activities: -Calls provider office for new concerns, questions, or BP outside discussed parameters  Interventions Today    Flowsheet Row Most Recent Value  Chronic Disease Discussed/Reviewed   Chronic disease discussed/reviewed during today's visit Hypertension (HTN)  General Interventions   General Interventions Discussed/Reviewed Doctor Visits, Health Screening  Doctor Visits Discussed/Reviewed Doctor Visits Discussed, Annual Wellness Visits  Health Screening Mammogram              SDOH assessments and interventions completed:  Yes  SDOH Interventions Today    Flowsheet Row Most Recent Value  SDOH Interventions   Housing Interventions Intervention Not Indicated  Utilities Interventions Intervention Not Indicated        Care Coordination Interventions:  Yes, provided   Follow up plan: No further intervention required.   Encounter Outcome:  Pt. Visit Completed   Jone Baseman, RN, MSN [place] Management Care Management Coordinator Direct Line [phone]

## 2022-06-09 ENCOUNTER — Ambulatory Visit (HOSPITAL_BASED_OUTPATIENT_CLINIC_OR_DEPARTMENT_OTHER)
Admission: RE | Admit: 2022-06-09 | Discharge: 2022-06-09 | Disposition: A | Discharge status: Home / Self Care | Payer: BC Managed Care – PPO | Source: Ambulatory Visit | Attending: Cardiology | Admitting: Cardiology

## 2022-06-09 ENCOUNTER — Ambulatory Visit (INDEPENDENT_AMBULATORY_CARE_PROVIDER_SITE_OTHER): Payer: BC Managed Care – PPO

## 2022-06-09 DIAGNOSIS — R9431 Abnormal electrocardiogram [ECG] [EKG]: Secondary | ICD-10-CM | POA: Insufficient documentation

## 2022-06-09 LAB — ECHOCARDIOGRAM COMPLETE
Area-P 1/2: 3.21 cm2
S' Lateral: 2.73 cm

## 2022-06-14 ENCOUNTER — Other Ambulatory Visit: Payer: Self-pay | Admitting: Family Medicine

## 2022-06-15 NOTE — Telephone Encounter (Signed)
Contact pt. Advise her that the Rx was one time sent. Per lab note, pt is to take OTC vitamin D (1000-2000)after completing the prescription. Inform pt we will denied this request. Verbalized understanding.

## 2022-08-05 MED ORDER — VITAMIN D (ERGOCALCIFEROL) 1.25 MG (50000 UNIT) PO CAPS: 50000.0000 [IU] | ORAL_CAPSULE | ORAL | 0 refills | Status: DC

## 2022-08-05 NOTE — Addendum Note (Signed)
Addended by: Elwin Mocha on: 08/05/2022 09:24 AM   Modules accepted: Orders

## 2022-12-16 LAB — HM MAMMOGRAPHY

## 2022-12-31 ENCOUNTER — Telehealth: Payer: BC Managed Care – PPO | Admitting: Nurse Practitioner

## 2022-12-31 DIAGNOSIS — U071 COVID-19: Secondary | ICD-10-CM | POA: Diagnosis not present

## 2022-12-31 MED ORDER — NIRMATRELVIR/RITONAVIR (PAXLOVID)TABLET
3.0000 | ORAL_TABLET | Freq: Two times a day (BID) | ORAL | 0 refills | Status: AC
Start: 2022-12-31 — End: 2023-01-05

## 2022-12-31 NOTE — Patient Instructions (Signed)
Dorothy Taylor, thank you for joining Claiborne Rigg, NP for today's virtual visit.  While this provider is not your primary care provider (PCP), if your PCP is located in our provider database this encounter information will be shared with them immediately following your visit.   A [facility] MyChart account gives you access to today's visit and all your visits, tests, and labs performed at [facility] " click here if you don't have a [facility] MyChart account or go to [email or web address]  Consent: (Patient) [place] Threat provided verbal consent for this virtual visit at the beginning of the encounter.  Current Medications:  Current Outpatient Medications:    nirmatrelvir/ritonavir (PAXLOVID) 20 x 150 MG & 10 x 100MG  TABS, Take 3 tablets by mouth 2 (two) times daily for 5 days. (Take nirmatrelvir 150 mg two tablets twice daily for 5 days and ritonavir 100 mg one tablet twice daily for 5 days) Patient GFR is 71, Disp: 30 tablet, Rfl: 0   escitalopram (LEXAPRO) 10 MG tablet, TAKE 1 TABLET DAILY, Disp: 90 tablet, Rfl: 3   fluticasone (FLONASE) 50 MCG/ACT nasal spray, PLACE 1 SPRAY INTO BOTH NOSTRILS DAILY AS NEEDED FOR ALLERGIES., Disp: 48 mL, Rfl: 1   hydrochlorothiazide (HYDRODIURIL) 12.5 MG tablet, TAKE 1 TABLET DAILY, Disp: 90 tablet, Rfl: 3   losartan (COZAAR) 25 MG tablet, TAKE 1 TABLET DAILY, Disp: 90 tablet, Rfl: 3   Multiple Vitamins-Minerals (MULTIVITAMIN WITH MINERALS) tablet, Take 1 tablet by mouth daily., Disp: , Rfl:    Omega-3 Fatty Acids (FISH OIL) 1000 MG CAPS, Take by mouth., Disp: , Rfl:    PSYLLIUM HUSK PO, Take 1,400 mg by mouth daily., Disp: , Rfl:    Semaglutide-Weight Management (WEGOVY) 0.25 MG/0.5ML SOAJ, Inject 1 mg into the skin once a week. Pt reports 1.7mg  injection once a week., Disp: , Rfl:    Vitamin D, Ergocalciferol, (DRISDOL) 1.25 MG (50000 UNIT) CAPS capsule, Take 1 capsule (50,000 Units total) by mouth every 7  (seven) days., Disp: 12 capsule, Rfl: 0   Medications ordered in this encounter:  Meds ordered this encounter  Medications   nirmatrelvir/ritonavir (PAXLOVID) 20 x 150 MG & 10 x 100MG  TABS    Sig: Take 3 tablets by mouth 2 (two) times daily for 5 days. (Take nirmatrelvir 150 mg two tablets twice daily for 5 days and ritonavir 100 mg one tablet twice daily for 5 days) Patient GFR is 71    Dispense:  30 tablet    Refill:  0    Order Specific Question:   Supervising Provider    Answer:   Merrilee Jansky [id number]     *If you need refills on other medications prior to your next appointment, please contact your pharmacy*  Follow-Up: Call back or seek an in-person evaluation if the symptoms worsen or if the condition fails to improve as anticipated.   Virtual Care [phone]  Other Instructions  Please keep well-hydrated and get plenty of rest. Start a saline nasal rinse to flush out your nasal passages. You can use plain Mucinex to help thin congestion. If you have a humidifier, you can use this daily as needed.    You are to wear a mask for 5 days from onset of your symptoms.  After day 5, if you have had no fever and you are feeling better with NO symptoms, you can end masking. Keep in mind you can be contagious 10 days from the onset of symptoms  After  day 5 if you have a fever or are having significant symptoms, please wear your mask for full 10 days.   If you note any worsening of symptoms, any significant shortness of breath or any chest pain, please seek ER evaluation ASAP.  Please do not delay care!    If you note any worsening of symptoms, any significant shortness of breath or any chest pain, please seek ER evaluation ASAP.  Please do not delay care!    If you have been instructed to have an in-person evaluation today at a local Urgent Care facility, please use the link below. It will take you to a list of all of our available [facility],  including address, phone number and hours of operation. Please do not delay care.  [facility]  If you or a family member do not have a primary care provider, use the link below to schedule a visit and establish care. When you choose a [facility] primary care physician or advanced practice provider, you gain a long-term partner in health. Find a Primary Care Provider  Learn more about [facility] in-office and virtual care options:  - Get Care Now

## 2022-12-31 NOTE — Progress Notes (Signed)
Virtual Visit Consent   Dorothy Taylor, you are scheduled for a virtual visit with a [facility] provider today. Just as with appointments in the office, your consent must be obtained to participate. Your consent will be active for this visit and any virtual visit you may have with one of our providers in the next 365 days. If you have a MyChart account, a copy of this consent can be sent to you electronically.  As this is a virtual visit, video technology does not allow for your provider to perform a traditional examination. This may limit your provider's ability to fully assess your condition. If your provider identifies any concerns that need to be evaluated in person or the need to arrange testing (such as labs, EKG, etc.), we will make arrangements to do so. Although advances in technology are sophisticated, we cannot ensure that it will always work on either your end or our end. If the connection with a video visit is poor, the visit may have to be switched to a telephone visit. With either a video or telephone visit, we are not always able to ensure that we have a secure connection.  By engaging in this virtual visit, you consent to the provision of healthcare and authorize for your insurance to be billed (if applicable) for the services provided during this visit. Depending on your insurance coverage, you may receive a charge related to this service.  I need to obtain your verbal consent now. Are you willing to proceed with your visit today? Dorothy Taylor has provided verbal consent on 12/31/2022 for a virtual visit (video or telephone). Claiborne Rigg, NP  Date: 12/31/2022 9:08 AM  Virtual Visit via Video Note   I, Claiborne Rigg, connected with  Dorothy Taylor  ([id number], Nov 03, 1970) on 12/31/22 at  9:00 AM EDT by a video-enabled telemedicine application and verified that I am speaking with the correct person using two  identifiers.  Location: Patient: Virtual Visit Location Patient: Home Provider: Virtual Visit Location Provider: Home Office   I discussed the limitations of evaluation and management by telemedicine and the availability of in person appointments. The patient expressed understanding and agreed to proceed.    History of Present Illness: Dorothy Taylor is a 52 y.o. who identifies as a female who was assigned female at birth, and is being seen today for COVID POSITIVE.  Tested positive for COVID. She notes the following symptoms which started 4 days ago: fever Tmax 100, headache, sinus pressure, sore throat. Has trip to [place] soon and would like to feel better prior to her trip.    Problems:  Patient Active Problem List   Diagnosis Date Noted   Hx of colonic polyp 11/16/2021   Fibroids, intramural 12/09/2020   Obesity, Class III, BMI 40-49.9 (morbid obesity) (HCC) 12/09/2020   Essential hypertension 03/17/2019   Depression, recurrent (HCC) 03/17/2019   Hyperlipidemia 03/17/2019    Allergies:  Allergies  Allergen Reactions   Crestor [Rosuvastatin] Other (See Comments)    Myalgias even with 5 mg twice a wk.   Medications:  Current Outpatient Medications:    nirmatrelvir/ritonavir (PAXLOVID) 20 x 150 MG & 10 x 100MG  TABS, Take 3 tablets by mouth 2 (two) times daily for 5 days. (Take nirmatrelvir 150 mg two tablets twice daily for 5 days and ritonavir 100 mg one tablet twice daily for 5 days) Patient GFR is 71, Disp: 30 tablet, Rfl: 0   escitalopram (LEXAPRO) 10 MG tablet, TAKE 1  TABLET DAILY, Disp: 90 tablet, Rfl: 3   fluticasone (FLONASE) 50 MCG/ACT nasal spray, PLACE 1 SPRAY INTO BOTH NOSTRILS DAILY AS NEEDED FOR ALLERGIES., Disp: 48 mL, Rfl: 1   hydrochlorothiazide (HYDRODIURIL) 12.5 MG tablet, TAKE 1 TABLET DAILY, Disp: 90 tablet, Rfl: 3   losartan (COZAAR) 25 MG tablet, TAKE 1 TABLET DAILY, Disp: 90 tablet, Rfl: 3   Multiple Vitamins-Minerals (MULTIVITAMIN WITH  MINERALS) tablet, Take 1 tablet by mouth daily., Disp: , Rfl:    Omega-3 Fatty Acids (FISH OIL) 1000 MG CAPS, Take by mouth., Disp: , Rfl:    PSYLLIUM HUSK PO, Take 1,400 mg by mouth daily., Disp: , Rfl:    Semaglutide-Weight Management (WEGOVY) 0.25 MG/0.5ML SOAJ, Inject 1 mg into the skin once a week. Pt reports 1.7mg  injection once a week., Disp: , Rfl:    Vitamin D, Ergocalciferol, (DRISDOL) 1.25 MG (50000 UNIT) CAPS capsule, Take 1 capsule (50,000 Units total) by mouth every 7 (seven) days., Disp: 12 capsule, Rfl: 0  Observations/Objective: Patient is well-developed, well-nourished in no acute distress.  Resting comfortably at home.  Head is normocephalic, atraumatic.  No labored breathing.  Speech is clear and coherent with logical content.  Patient is alert and oriented at baseline.    Assessment and Plan: 1. Positive self-administered antigen test for COVID-19 - nirmatrelvir/ritonavir (PAXLOVID) 20 x 150 MG & 10 x 100MG  TABS; Take 3 tablets by mouth 2 (two) times daily for 5 days. (Take nirmatrelvir 150 mg two tablets twice daily for 5 days and ritonavir 100 mg one tablet twice daily for 5 days) Patient GFR is 71  Dispense: 30 tablet; Refill: 0   Please keep well-hydrated and get plenty of rest. Start a saline nasal rinse to flush out your nasal passages. You can use plain Mucinex to help thin congestion. If you have a humidifier, you can use this daily as needed.    You are to wear a mask for 5 days from onset of your symptoms.  After day 5, if you have had no fever and you are feeling better with NO symptoms, you can end masking. Keep in mind you can be contagious 10 days from the onset of symptoms  After day 5 if you have a fever or are having significant symptoms, please wear your mask for full 10 days.   If you note any worsening of symptoms, any significant shortness of breath or any chest pain, please seek ER evaluation ASAP.  Please do not delay care!    If you note any  worsening of symptoms, any significant shortness of breath or any chest pain, please seek ER evaluation ASAP.  Please do not delay care!   Follow Up Instructions: I discussed the assessment and treatment plan with the patient. The patient was provided an opportunity to ask questions and all were answered. The patient agreed with the plan and demonstrated an understanding of the instructions.  A copy of instructions were sent to the patient via MyChart unless otherwise noted below.    The patient was advised to call back or seek an in-person evaluation if the symptoms worsen or if the condition fails to improve as anticipated.  Time:  I spent 12 minutes with the patient via telehealth technology discussing the above problems/concerns.    Claiborne Rigg, NP

## 2023-01-02 ENCOUNTER — Encounter: Payer: Self-pay | Admitting: Family Medicine

## 2023-02-01 DIAGNOSIS — J9801 Acute bronchospasm: Secondary | ICD-10-CM | POA: Insufficient documentation

## 2023-02-01 DIAGNOSIS — N946 Dysmenorrhea, unspecified: Secondary | ICD-10-CM | POA: Insufficient documentation

## 2023-02-01 DIAGNOSIS — F419 Anxiety disorder, unspecified: Secondary | ICD-10-CM | POA: Insufficient documentation

## 2023-02-01 DIAGNOSIS — R059 Cough, unspecified: Secondary | ICD-10-CM | POA: Insufficient documentation

## 2023-02-01 DIAGNOSIS — J019 Acute sinusitis, unspecified: Secondary | ICD-10-CM | POA: Insufficient documentation

## 2023-02-02 ENCOUNTER — Ambulatory Visit (INDEPENDENT_AMBULATORY_CARE_PROVIDER_SITE_OTHER): Payer: BC Managed Care – PPO | Admitting: Family Medicine

## 2023-02-02 ENCOUNTER — Encounter: Payer: Self-pay | Admitting: Family Medicine

## 2023-02-02 VITALS — BP 110/72 | HR 76 | Temp 98.2°F | Ht 65.35 in | Wt 206.0 lb

## 2023-02-02 DIAGNOSIS — R208 Other disturbances of skin sensation: Secondary | ICD-10-CM

## 2023-02-02 DIAGNOSIS — Z0000 Encounter for general adult medical examination without abnormal findings: Secondary | ICD-10-CM

## 2023-02-02 DIAGNOSIS — F339 Major depressive disorder, recurrent, unspecified: Secondary | ICD-10-CM

## 2023-02-02 DIAGNOSIS — E782 Mixed hyperlipidemia: Secondary | ICD-10-CM | POA: Diagnosis not present

## 2023-02-02 DIAGNOSIS — G2581 Restless legs syndrome: Secondary | ICD-10-CM

## 2023-02-02 DIAGNOSIS — I10 Essential (primary) hypertension: Secondary | ICD-10-CM | POA: Diagnosis not present

## 2023-02-02 DIAGNOSIS — Z23 Encounter for immunization: Secondary | ICD-10-CM | POA: Diagnosis not present

## 2023-02-02 LAB — COMPREHENSIVE METABOLIC PANEL
ALT: 18 U/L (ref 0–35)
AST: 17 U/L (ref 0–37)
Albumin: 4.2 g/dL (ref 3.5–5.2)
Alkaline Phosphatase: 61 U/L (ref 39–117)
BUN: 13 mg/dL (ref 6–23)
CO2: 29 meq/L (ref 19–32)
Calcium: 9.5 mg/dL (ref 8.4–10.5)
Chloride: 103 meq/L (ref 96–112)
Creatinine, Ser: 0.74 mg/dL (ref 0.40–1.20)
GFR: 93.92 mL/min (ref >60.00)
Glucose, Bld: 84 mg/dL (ref 70–99)
Potassium: 3.5 meq/L (ref 3.5–5.1)
Sodium: 140 meq/L (ref 135–145)
Total Bilirubin: 0.7 mg/dL (ref 0.2–1.2)
Total Protein: 7.3 g/dL (ref 6.0–8.3)

## 2023-02-02 LAB — LIPID PANEL
Cholesterol: 289 mg/dL — ABNORMAL HIGH (ref 0–200)
HDL: 62.1 mg/dL (ref >39.00)
LDL Cholesterol: 197 mg/dL — ABNORMAL HIGH (ref 0–99)
NonHDL: 226.71
Total CHOL/HDL Ratio: 5
Triglycerides: 147 mg/dL (ref 0.0–149.0)
VLDL: 29.4 mg/dL (ref 0.0–40.0)

## 2023-02-02 LAB — CBC WITH DIFFERENTIAL/PLATELET
Basophils Absolute: 0 10*3/uL (ref 0.0–0.1)
Basophils Relative: 0.5 % (ref 0.0–3.0)
Eosinophils Absolute: 0.1 10*3/uL (ref 0.0–0.7)
Eosinophils Relative: 2 % (ref 0.0–5.0)
HCT: 41.4 % (ref 36.0–46.0)
Hemoglobin: 13.8 g/dL (ref 12.0–15.0)
Lymphocytes Relative: 39.1 % (ref 12.0–46.0)
Lymphs Abs: 2.3 10*3/uL (ref 0.7–4.0)
MCHC: 33.3 g/dL (ref 30.0–36.0)
MCV: 91.3 fL (ref 78.0–100.0)
Monocytes Absolute: 0.3 10*3/uL (ref 0.1–1.0)
Monocytes Relative: 5.4 % (ref 3.0–12.0)
Neutro Abs: 3.1 10*3/uL (ref 1.4–7.7)
Neutrophils Relative %: 53 % (ref 43.0–77.0)
Platelets: 261 10*3/uL (ref 150.0–400.0)
RBC: 4.53 Mil/uL (ref 3.87–5.11)
RDW: 12.8 % (ref 11.5–15.5)
WBC: 5.9 10*3/uL (ref 4.0–10.5)

## 2023-02-02 LAB — VITAMIN B12: Vitamin B-12: >1537 pg/mL — ABNORMAL HIGH (ref 211–911)

## 2023-02-02 LAB — TSH: TSH: 1.45 u[IU]/mL (ref 0.35–5.50)

## 2023-02-02 LAB — MAGNESIUM: Magnesium: 2 mg/dL (ref 1.5–2.5)

## 2023-02-02 LAB — HEMOGLOBIN A1C: Hgb A1c MFr Bld: 5.2 % (ref 4.6–6.5)

## 2023-02-02 LAB — VITAMIN D 25 HYDROXY (VIT D DEFICIENCY, FRACTURES): VITD: 29.75 ng/mL — ABNORMAL LOW (ref 30.00–100.00)

## 2023-02-02 LAB — FOLATE: Folate: >24.2 ng/mL (ref >5.9)

## 2023-02-02 LAB — T4, FREE: Free T4: 0.74 ng/dL (ref 0.60–1.60)

## 2023-02-02 MED ORDER — HYDROCHLOROTHIAZIDE 12.5 MG PO TABS: 12.5000 mg | ORAL_TABLET | Freq: Every day | ORAL | 3 refills | Status: DC

## 2023-02-02 MED ORDER — LOSARTAN POTASSIUM 25 MG PO TABS: 25.0000 mg | ORAL_TABLET | Freq: Every day | ORAL | 3 refills | Status: DC

## 2023-02-02 MED ORDER — ESCITALOPRAM OXALATE 10 MG PO TABS: 10.0000 mg | ORAL_TABLET | Freq: Every day | ORAL | 3 refills | Status: DC

## 2023-02-02 NOTE — Progress Notes (Signed)
Established Patient Office Visit   Subjective  Patient ID: Dorothy Taylor [facility], female    DOB: [birth year]  Age: 52 y.o. MRN: [id number]  Chief Complaint  Patient presents with   Annual Exam    Patient is a 52 year old female who presents for CPE.  Patient states has been doing well overall.  Has noticed decreased sensation in toes.  States it touches her toe and does not feel the same as it used to.  Patient notices the sensation more at night.  Has also noticed feet are extremely cold in bed at night.  No longer having tingling in LEs.  Denies claudication.  Patient did not have difficulty walking/climbing during recent trip to [place].  Endorses some tightness in L heel/Achilles due to history of plantar fasciitis and a history of RLS.  Seen by cardiology, advised circulation in extremities was good.  BP at home well-controlled.  20 pound weight loss has helped.  Readings in July were 113/74, 126/68, 111/68.  Inquires if she can stop one of the BP meds.  Currently taking HCTZ 12.5 mg and losartan 25 mg daily.  Pt followed by [facility].  Last Pap 10/09/2020.  Patient interested in and Tdap vaccines.  Would like to wait on shingles vaccine.  Patient never had chickenpox as a child but did have varicella vaccine in her 30s.    Patient Active Problem List   Diagnosis Date Noted   Acute sinusitis 02/01/2023   Anxiety 02/01/2023   Bronchospasm 02/01/2023   Cough 02/01/2023   Dysmenorrhea 02/01/2023   Hx of colonic polyp 11/16/2021   Fibroids, intramural 12/09/2020   Pelvic mass 11/25/2020   Depressive disorder 03/17/2019   Hyperlipidemia 03/17/2019   Obesity, unspecified 02/20/2018   Menorrhagia 02/20/2018   Hypovitaminosis D 05/11/2012   HTN (hypertension) 02/13/2012   Past Medical History:  Diagnosis Date   Allergy    Anxiety    Blood transfusion without reported diagnosis    Depression    Hx of colonic polyp 11/16/2021   Hyperlipidemia    Hypertension    Past  Surgical History:  Procedure Laterality Date   ROBOTIC ASSISTED LAPAROSCOPIC HYSTERECTOMY AND SALPINGECTOMY Bilateral 01/05/2021   Procedure: XI ROBOTIC ASSISTED LAPAROSCOPIC HYSTERECTOMY GREATER THAN TWO HUNDRED AND FIFTY GRAMS AND SALPINGECTOMY WITH MINI LAPAROTOMY;  Surgeon: Adolphus Birchwood, MD;  Location: [facility];  Service: Gynecology;  Laterality: Bilateral;   Social History   Tobacco Use   Smoking status: Never   Smokeless tobacco: Never  Vaping Use   Vaping status: Never Used  Substance Use Topics   Alcohol use: Yes    Comment: wine- once  - twice per week   Drug use: Never   Family History  Problem Relation Age of Onset   Colon polyps Mother    Hypertension Mother    Hyperlipidemia Mother    Pancreatic cancer Father    Hyperlipidemia Father    Hypertension Father    Heart attack Father    Depression Father    Cancer Father    Asthma Sister    Depression Sister    Colon cancer Paternal Aunt    Hypertension Maternal Grandmother    Hyperlipidemia Maternal Grandmother    Hypertension Maternal Grandfather    Hyperlipidemia Maternal Grandfather    Heart disease Maternal Grandfather    Heart attack Maternal Grandfather    Esophageal cancer Neg Hx    Rectal cancer Neg Hx    Stomach cancer Neg Hx    Allergies  Allergen  Reactions   Crestor [Rosuvastatin] Other (See Comments)    Myalgias even with 5 mg twice a wk.      ROS Negative unless stated above    Objective:     BP 110/72 (BP Location: Left Arm, Patient Position: Sitting, Cuff Size: Normal)   Pulse 76   Temp 98.2 F (36.8 C) (Oral)   Ht 5' 5.35" (1.66 m)   Wt 206 lb (93.4 kg)   LMP 07/10/2019 (Exact Date)   SpO2 98%   BMI 33.91 kg/m  BP Readings from Last 3 Encounters:  02/02/23 110/72  04/26/22 126/88  03/25/22 104/78   Wt Readings from Last 3 Encounters:  02/02/23 206 lb (93.4 kg)  04/26/22 227 lb (103 kg)  03/25/22 231 lb (104.8 kg)      Physical Exam Constitutional:      Appearance:  Normal appearance.  HENT:     Head: Normocephalic and atraumatic.     Right Ear: Tympanic membrane, ear canal and external ear normal.     Left Ear: Tympanic membrane, ear canal and external ear normal.     Nose: Nose normal.     Mouth/Throat:     Mouth: Mucous membranes are moist.     Pharynx: No oropharyngeal exudate or posterior oropharyngeal erythema.  Eyes:     General: No scleral icterus.    Extraocular Movements: Extraocular movements intact.     Conjunctiva/sclera: Conjunctivae normal.     Pupils: Pupils are equal, round, and reactive to light.  Neck:     Thyroid: No thyromegaly.  Cardiovascular:     Rate and Rhythm: Normal rate and regular rhythm.     Pulses: Normal pulses.     Heart sounds: Normal heart sounds. No murmur heard.    No friction rub.  Pulmonary:     Effort: Pulmonary effort is normal.     Breath sounds: Normal breath sounds. No wheezing, rhonchi or rales.  Abdominal:     General: Bowel sounds are normal.     Palpations: Abdomen is soft.     Tenderness: There is no abdominal tenderness.  Musculoskeletal:        General: No deformity. Normal range of motion.     Right lower leg: No edema.     Left lower leg: No swelling. No edema.     Right foot: Normal.     Left foot: Normal.     Comments: Callus plantar surface of right great toe.  No varicosities of bilateral LEs.  Lymphadenopathy:     Cervical: No cervical adenopathy.  Skin:    General: Skin is warm and dry.     Findings: No lesion.  Neurological:     General: No focal deficit present.     Mental Status: She is alert and oriented to person, place, and time.     Cranial Nerves: Cranial nerves 2-12 are intact.     Coordination: Coordination is intact.     Gait: Gait is intact.  Psychiatric:        Mood and Affect: Mood normal.        Thought Content: Thought content normal.     Diabetic Foot Exam - Simple   Simple Foot Form Diabetic Foot exam was performed with the following findings: Yes  02/02/2023  9:23 AM  Visual Inspection No deformities, no ulcerations, no other skin breakdown bilaterally: Yes Sensation Testing Intact to touch and monofilament testing bilaterally: Yes See comments: Yes Pulse Check Posterior Tibialis and Dorsalis pulse intact bilaterally:  Yes Comments Decreased vibratory sense of right toes.       02/02/2023    8:38 AM 05/17/2022    9:40 AM 03/25/2022    1:44 PM  Depression screen PHQ 2/9  Decreased Interest 0 0 0  Down, Depressed, Hopeless 0 0 0  PHQ - 2 Score 0 0 0  Altered sleeping 1  0  Tired, decreased energy 1  0  Change in appetite 0  0  Feeling bad or failure about yourself  0  0  Trouble concentrating 0  0  Moving slowly or fidgety/restless 0  0  Suicidal thoughts 0  0  PHQ-9 Score 2  0  Difficult doing work/chores Not difficult at all        02/02/2023    8:38 AM 03/17/2021    8:14 AM 03/16/2020    8:47 AM  GAD 7 : Generalized Anxiety Score  Nervous, Anxious, on Edge 2 0 0  Control/stop worrying 1 0 0  Worry too much - different things 0 0 0  Trouble relaxing 1 0 0  Restless 1 0 0  Easily annoyed or irritable 1 0 0  Afraid - awful might happen 0 0 0  Total GAD 7 Score 6 0 0  Anxiety Difficulty Not difficult at all  Not difficult at all     No results found for any visits on 02/02/23.    Assessment & Plan:  Well adult exam -Age-appropriate health screenings discussed -Will obtain labs -Mammogram done 12/16/2022 -Colonoscopy done 11/09/2021 -Immunizations reviewed.  Tdap and flu vaccines given this visit.  Patient wishes to wait on shingles vaccine at this time. -Pap done 10/09/2020 with [facility].  Next Pap 2027. -Next CPE in 1 year -     Hemoglobin A1c  Essential hypertension -Controlled -Will have patient monitor BP more consistently over the next few weeks.  For continued readings in the 1 teens consider holding HCTZ. -For now continue HCTZ 12.5 mg daily and losartan 25 mg daily. -Continue lifestyle  modifications -     CBC with Differential/Platelet -     Comprehensive metabolic panel -     hydroCHLOROthiazide; Take 1 tablet (12.5 mg total) by mouth daily.  Dispense: 90 tablet; Refill: 3 -     Losartan Potassium; Take 1 tablet (25 mg total) by mouth daily.  Dispense: 90 tablet; Refill: 3 -     T4, free  Mixed hyperlipidemia -Continue lifestyle modifications -Continue fish oil tablets -     Lipid panel  Need for influenza vaccination -     Flu vaccine trivalent PF, 6mos and older(Flulaval,Afluria,Fluarix,Fluzone)  Need for tetanus booster -     Tdap vaccine greater than or equal to 7yo IM  Depression, recurrent (HCC) -Stable -PHQ-9 score 2 -GAD-7 score 6 -Escitalopram milligrams daily. -     TSH -     Escitalopram Oxalate; Take 1 tablet (10 mg total) by mouth daily.  Dispense: 90 tablet; Refill: 3 -     T4, free  Decreased sensation of foot -Discussed possible causes including neuropathy, Morton's neuroma, vitamin or electrolyte deficiency, vascular issue. -Obtain labs -Consider OTC magnesium supplements at night. -For continued or worsening symptoms consider EMG/NCS with neurology.  Wishes to wait at this time. -     CBC with Differential/Platelet -     Comprehensive metabolic panel -     TSH -     Vitamin B12 -     VITAMIN D 25 Hydroxy (Vit-D Deficiency, Fractures) -  Magnesium -     Folate  Restless leg -Consider OTC magnesium supplement -     Comprehensive metabolic panel -     Magnesium -     Folate   Return in about 6 weeks (around 03/16/2023), or if symptoms worsen or fail to improve.   Deeann Saint, MD

## 2023-02-02 NOTE — Patient Instructions (Signed)
  Magnesium Oxide or Magnesium Glycinate 500 mg at bed (up to 800 mg daily)

## 2023-06-06 IMAGING — CT CT ABD-PELV W/ CM
2 of 5 series · 13 of 46 positions shown, 15 images · IV contrast (iopamidol)
Comparison: None.

CLINICAL DATA: Evaluate suspicious swelling and pelvis.

EXAM:
CT ABDOMEN AND PELVIS WITH CONTRAST
TECHNIQUE: Multidetector CT imaging of the abdomen and pelvis was performed
using the standard protocol following bolus administration of
intravenous contrast.
CONTRAST:  100mL PWWPJP-WQQ IOPAMIDOL (PWWPJP-WQQ) INJECTION 61%

[Series 2: abd pelvis 5.00 br40 s3 axial · axial · 0.73mm/px · z∈[+1087,+1517]mm · 10 of 98 slices shown, 12 images]
[im 6/98  soft-tissue]
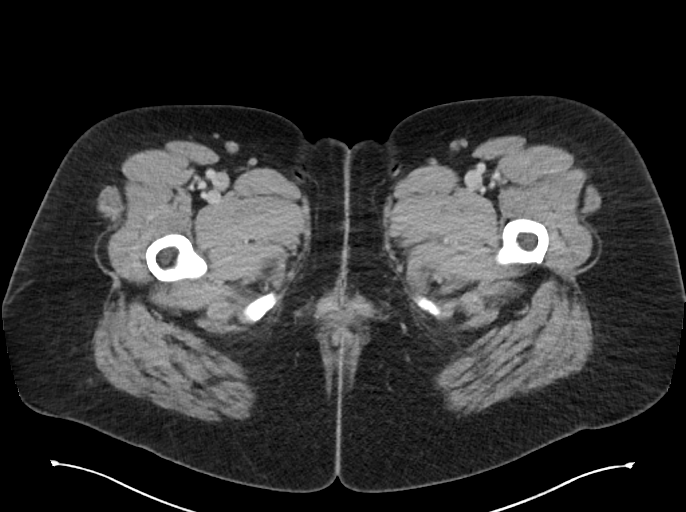
[im 6/98  bone]
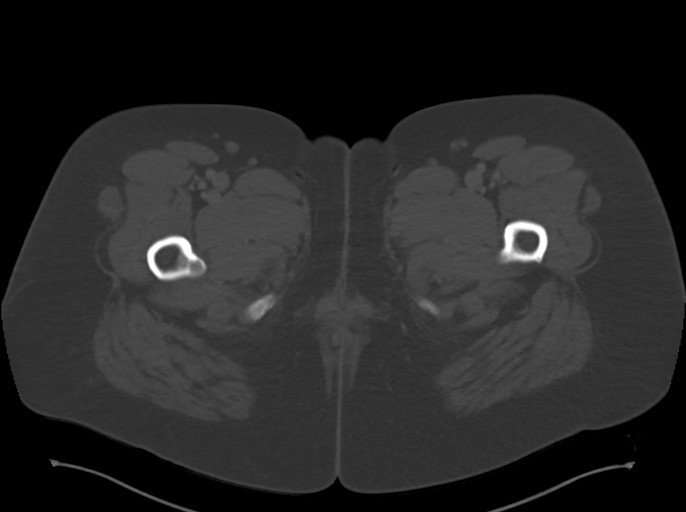
[im 17/98  soft-tissue]
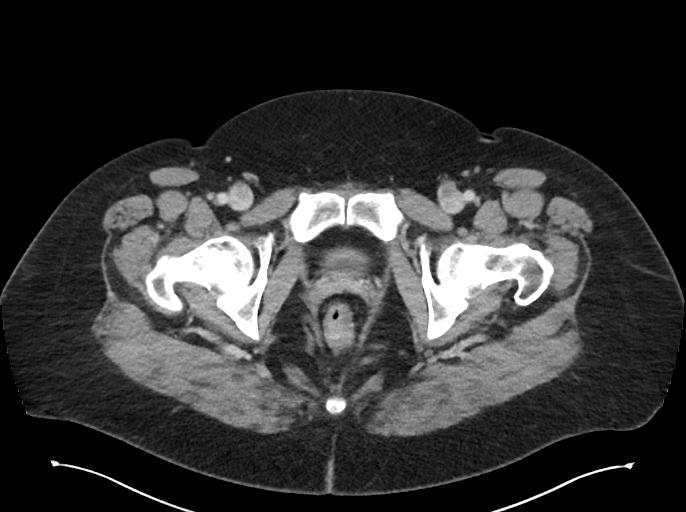
[im 27/98  soft-tissue]
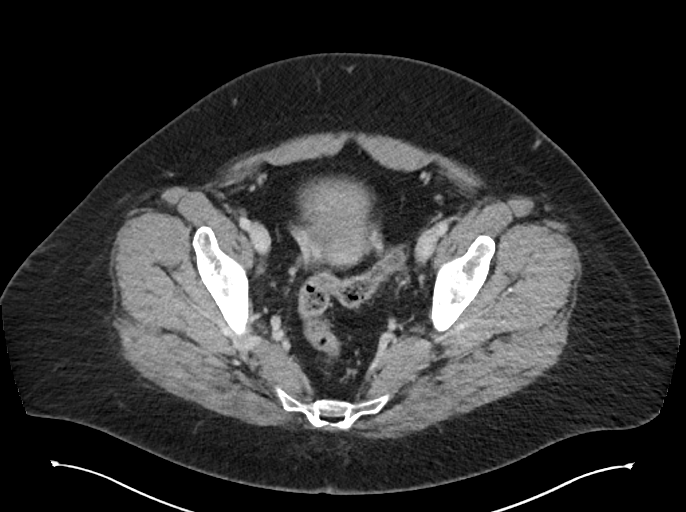
[im 33/98  soft-tissue]
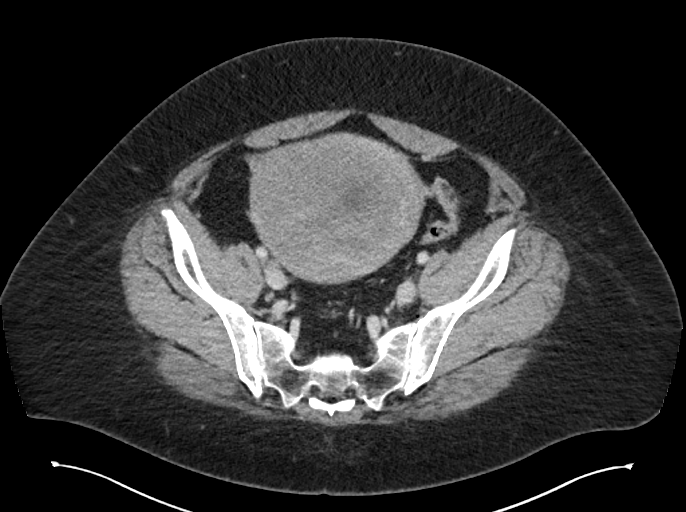
[im 44/98  soft-tissue]
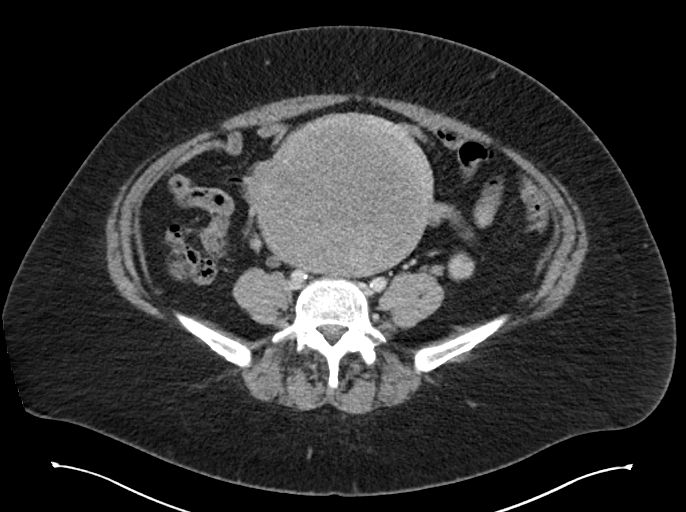
[im 54/98  soft-tissue]
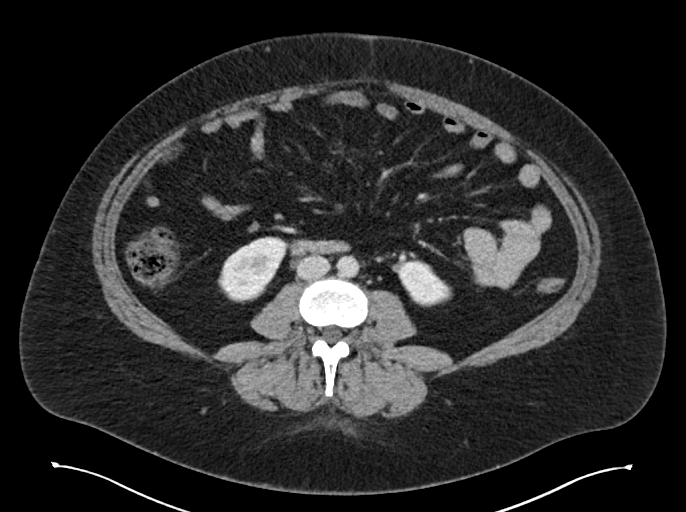
[im 65/98  soft-tissue]
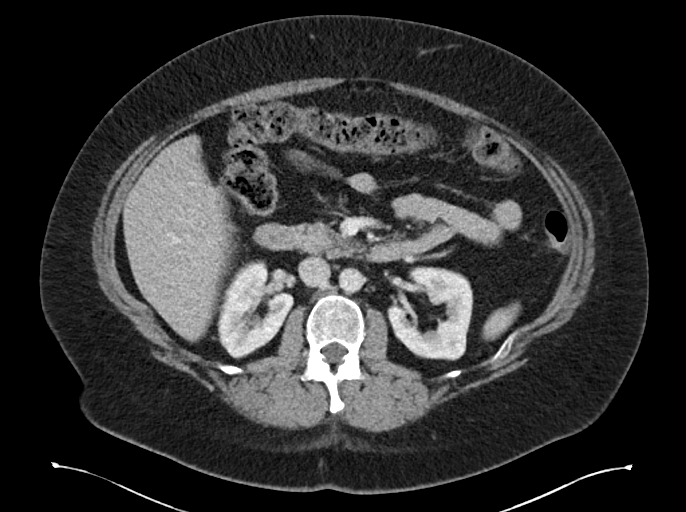
[im 71/98  soft-tissue]
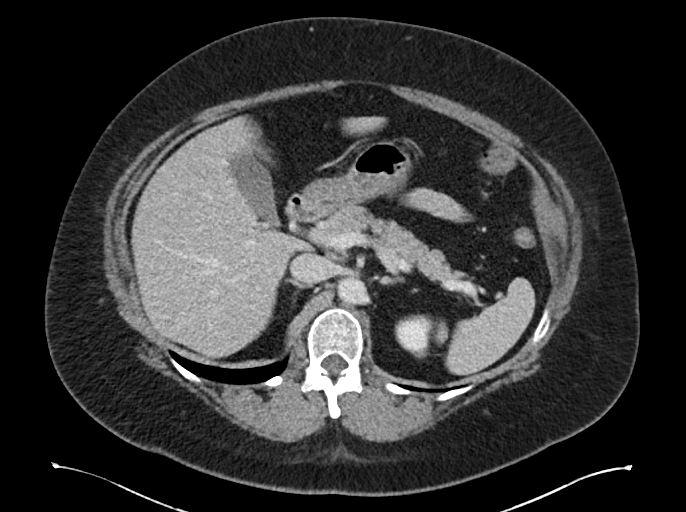
[im 81/98  soft-tissue]
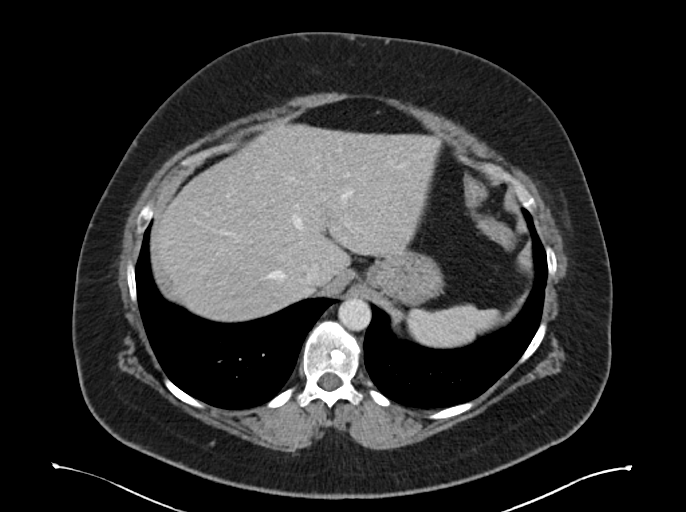
[im 81/98  bone]
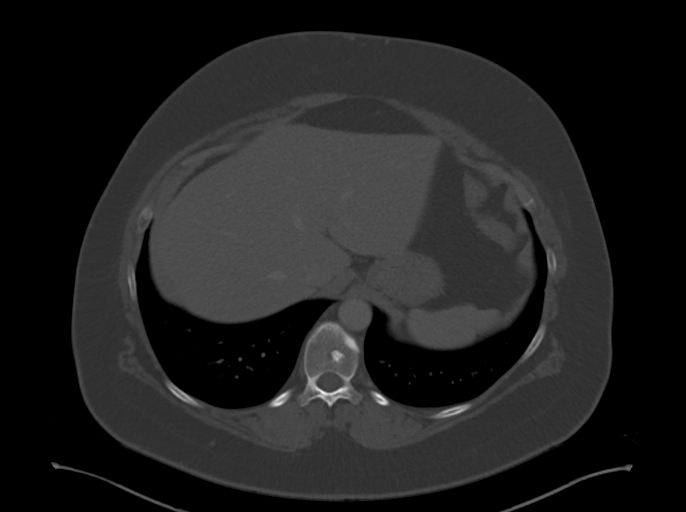
[im 92/98  soft-tissue]
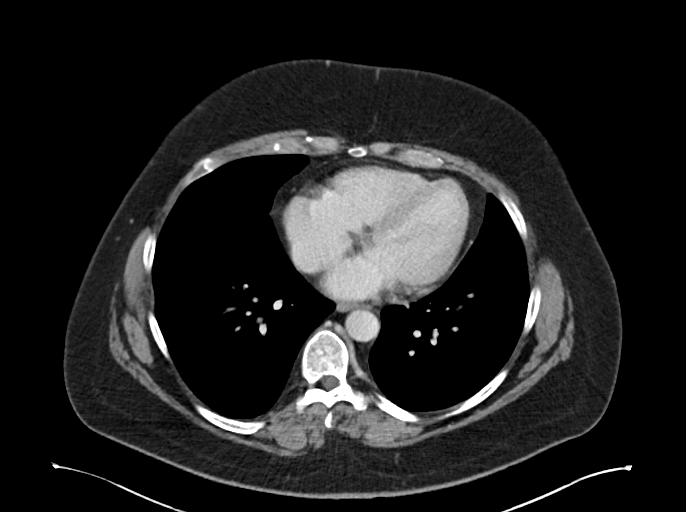

[Series 6: abd pelvis 2.00 br40 s3 cor · coronal · 0.96mm/px · 3 of 182 slices shown]
[im 61/182  soft-tissue]
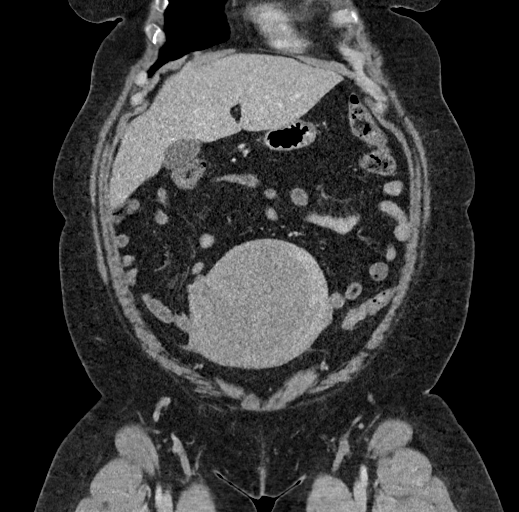
[im 81/182  soft-tissue]
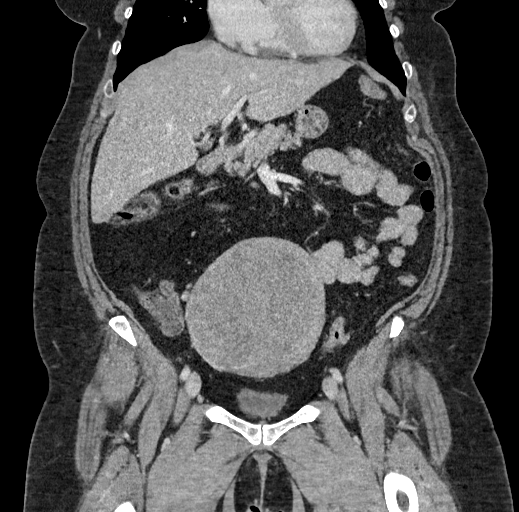
[im 101/182  soft-tissue]
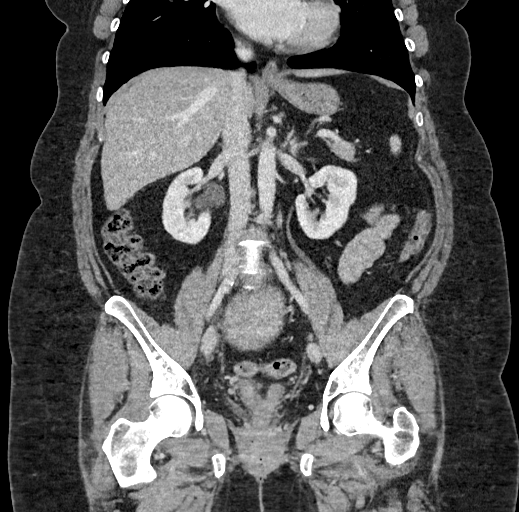

[13 of 46 positions shown; findings below may reference images not displayed]

FINDINGS: Lower chest: No acute abnormality.

Hepatobiliary: No focal liver abnormality is seen. No gallstones,
gallbladder wall thickening, or biliary dilatation.

Pancreas: Unremarkable. No pancreatic ductal dilatation or
surrounding inflammatory changes.

Spleen: Normal in size without focal abnormality.

Adrenals/Urinary Tract: Adrenal glands are unremarkable. No kidney
mass, or nephrolithiasis. There is mild right hydronephrosis and
hydroureter. This is likely secondary to compression of the distal
ureter by enlarged uterus. Bladder is unremarkable.

Stomach/Bowel: Stomach is normal. The appendix is visualized and is
unremarkable. There is no bowel wall thickening, inflammation, or
distension.

Vascular/Lymphatic: Mild aortic atherosclerotic calcifications
identified. No abdominopelvic adenopathy. No inguinal adenopathy
identified.

Reproductive: There is a large well-circumscribed mass within the
uterus which measures 12.9 x 11.4 by 10.7 cm, image 58/2. There is
mild heterogeneity within the inferior portion of this mass which
may represent areas of internal cystic degeneration. The endometrium
appears displaced ventrally and to the left measuring approximately
7 mm in thickness.

No adnexal mass identified.

Other: No free fluid or fluid collections.

Musculoskeletal: No acute or significant osseous findings.
IMPRESSION: 1. Large well-circumscribed mass within the uterus is identified.
This is favored to represent a large leiomyoma. Unfortunately, CT
cannot reliably differentiated benign leiomyoma from a
leiomyosarcoma. If the patient is symptomatic or reports rapidly
enlarging mass biopsy may be necessary to rule out underlying
malignancy.
2. Mild right hydronephrosis and hydroureter. This is likely
secondary to compression of the distal ureter by enlarged uterus.
3. Aortic atherosclerosis.

Aortic Atherosclerosis ([id number]).

## 2023-11-01 IMAGING — US US CAROTID DUPLEX BILAT
1 series · 13 of 24 positions shown · non-contrast
Comparison: None.

CLINICAL DATA: Asymptomatic carotid bruit, hypertension,
hyperlipidemia and dizziness

EXAM:
BILATERAL CAROTID DUPLEX ULTRASOUND
TECHNIQUE: Gray scale imaging, color Doppler and duplex ultrasound were
performed of bilateral carotid and vertebral arteries in the neck.

[Series 1: us carotid duplex bilat · 0.06mm/px · 13 of 58 slices shown]
[im 1/58]
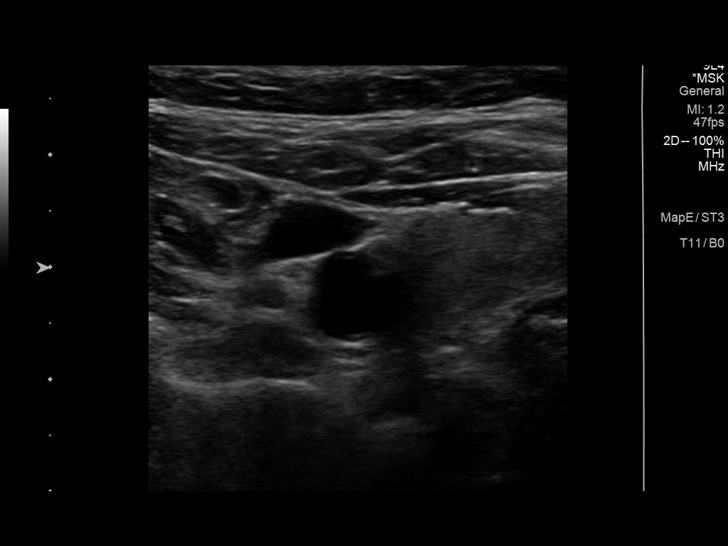
[im 5/58]
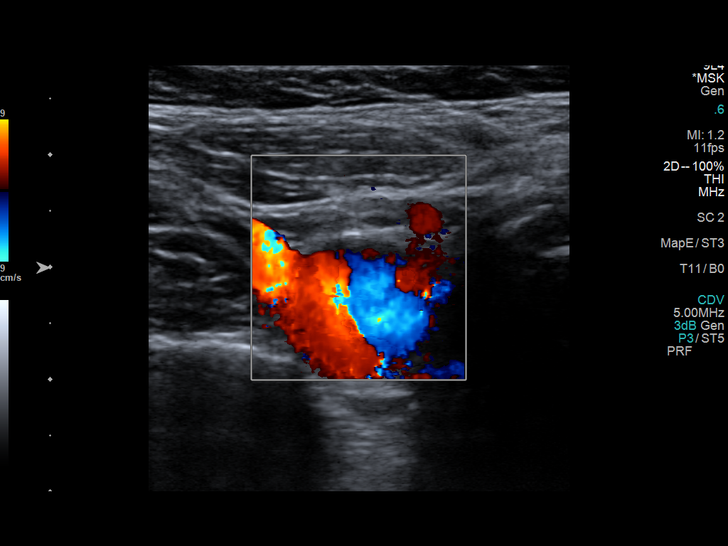
[im 10/58]
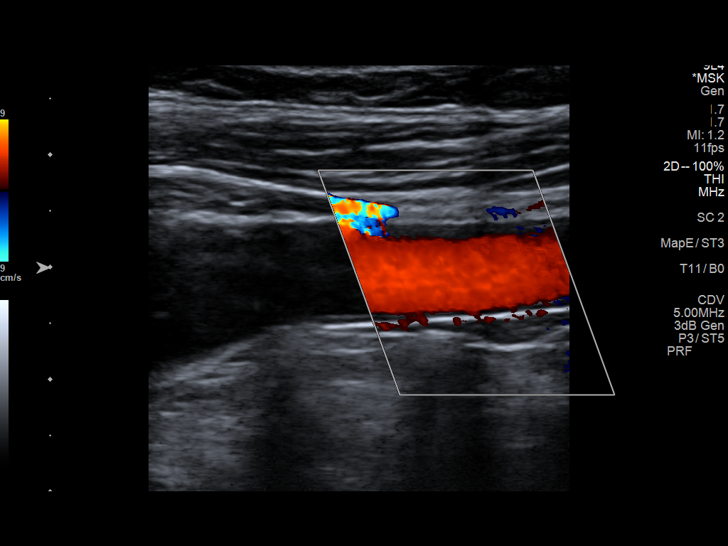
[im 15/58]
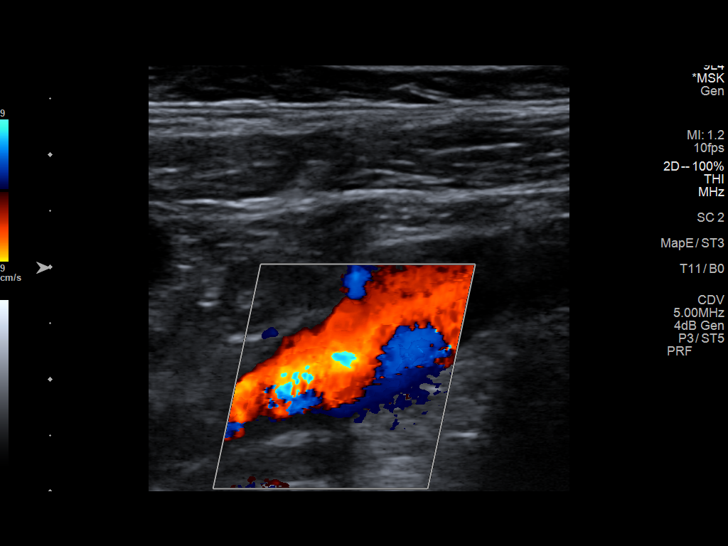
[im 20/58]
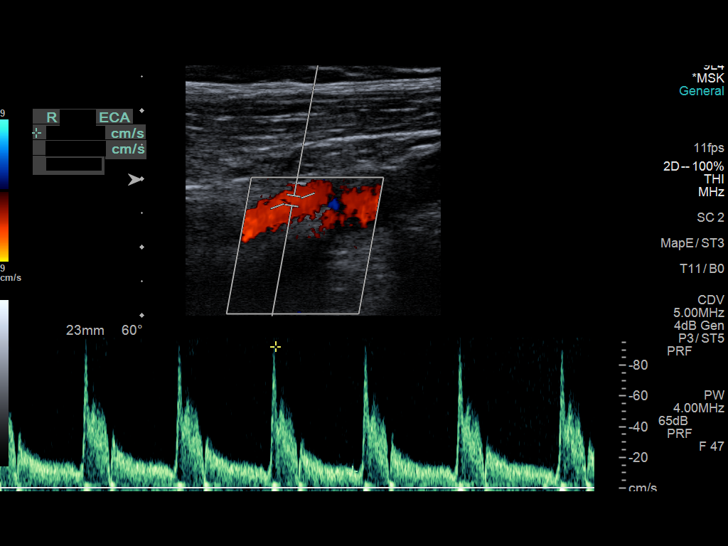
[im 25/58]
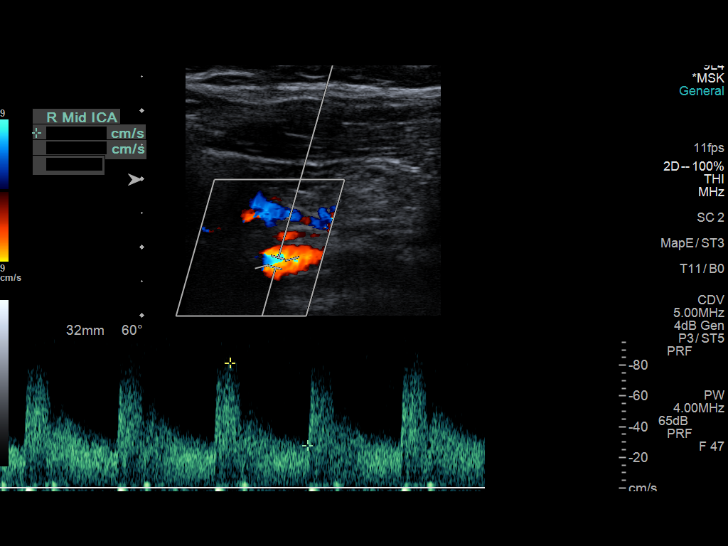
[im 30/58]
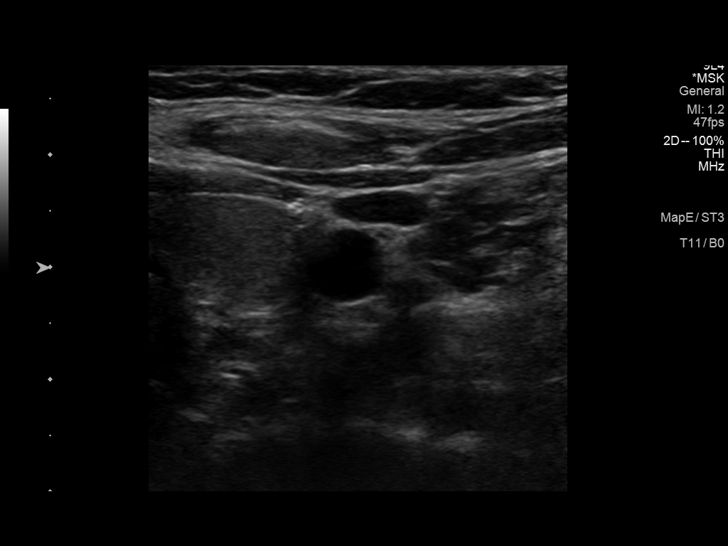
[im 33/58]
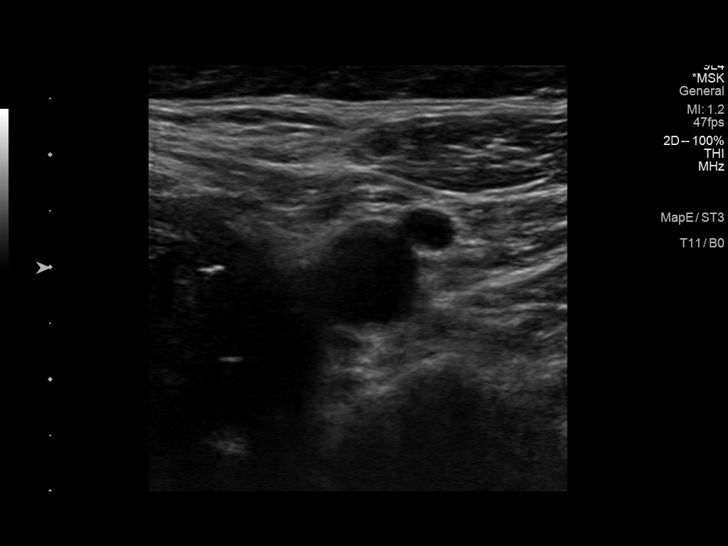
[im 38/58]
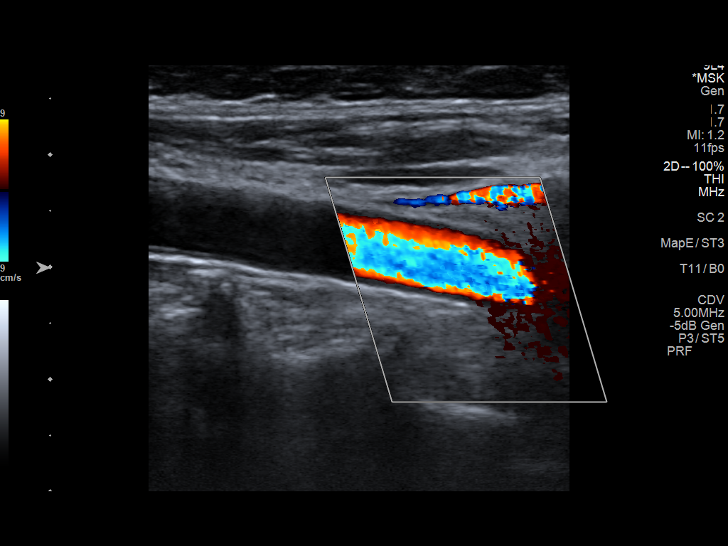
[im 43/58]
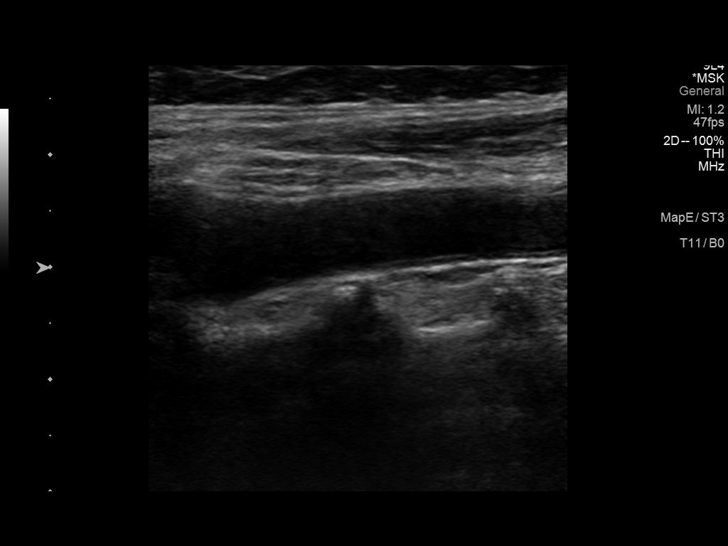
[im 48/58]
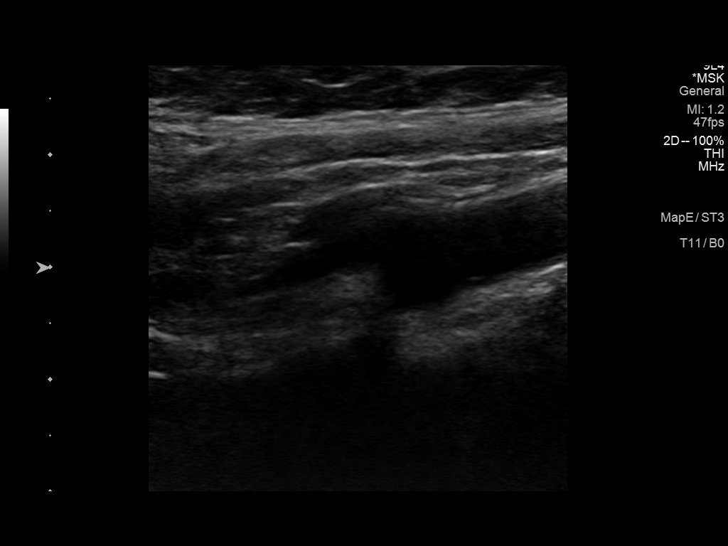
[im 53/58]
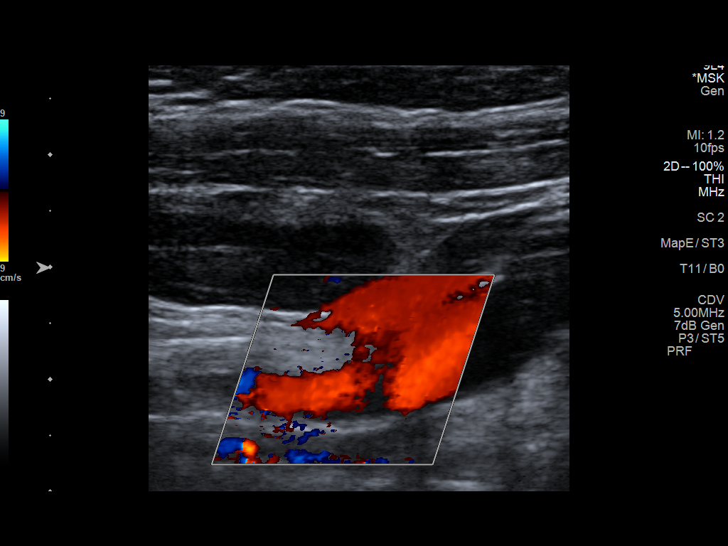
[im 58/58]
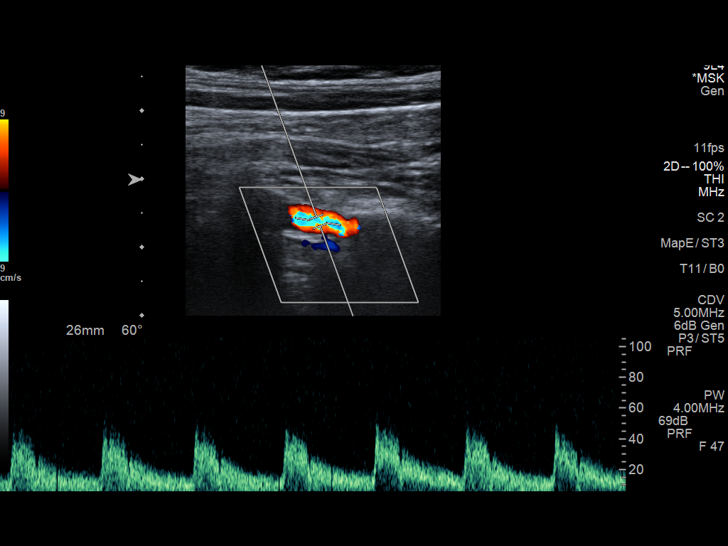

[13 of 24 positions shown; findings below may reference images not displayed]

FINDINGS: Criteria: Quantification of carotid stenosis is based on velocity
parameters that correlate the residual internal carotid diameter
with NASCET-based stenosis levels, using the diameter of the distal
internal carotid lumen as the denominator for stenosis measurement.

The following velocity measurements were obtained:

RIGHT

ICA: 92/32 cm/sec

CCA: 66/7 cm/sec

SYSTOLIC ICA/CCA RATIO:

ECA: 92 cm/sec

LEFT

ICA: 81/31 cm/sec

CCA: 101/14 cm/sec

SYSTOLIC ICA/CCA RATIO:

ECA: 62 cm/sec

RIGHT CAROTID ARTERY: Trace intimal thickening without significant
carotid atherosclerosis. Negative for stenosis, velocity elevation,
turbulent flow. Degree of narrowing less than 50% by ultrasound
criteria.

RIGHT VERTEBRAL ARTERY:  Normal antegrade flow

LEFT CAROTID ARTERY: Trace intimal thickening without significant
atherosclerosis. Negative for stenosis, velocity elevation,
turbulent flow. Degree of narrowing also less than 50% by ultrasound
criteria.

LEFT VERTEBRAL ARTERY:  Normal antegrade flow

Upper extremity blood pressures: RIGHT: 137 LEFT: 148
IMPRESSION: Trace carotid intimal thickening without significant
atherosclerosis. Negative for stenosis. Degree of narrowing less
than 50% bilaterally by ultrasound criteria.

Patent antegrade vertebral flow bilaterally

## 2024-02-07 ENCOUNTER — Ambulatory Visit (INDEPENDENT_AMBULATORY_CARE_PROVIDER_SITE_OTHER): Admitting: Family Medicine

## 2024-02-07 ENCOUNTER — Encounter: Payer: Self-pay | Admitting: Family Medicine

## 2024-02-07 VITALS — BP 106/64 | HR 61 | Temp 98.3°F | Ht 63.5 in | Wt 207.0 lb

## 2024-02-07 DIAGNOSIS — F339 Major depressive disorder, recurrent, unspecified: Secondary | ICD-10-CM

## 2024-02-07 DIAGNOSIS — R0789 Other chest pain: Secondary | ICD-10-CM | POA: Diagnosis not present

## 2024-02-07 DIAGNOSIS — E782 Mixed hyperlipidemia: Secondary | ICD-10-CM | POA: Diagnosis not present

## 2024-02-07 DIAGNOSIS — Z0000 Encounter for general adult medical examination without abnormal findings: Secondary | ICD-10-CM | POA: Diagnosis not present

## 2024-02-07 DIAGNOSIS — M545 Low back pain, unspecified: Secondary | ICD-10-CM | POA: Diagnosis not present

## 2024-02-07 DIAGNOSIS — I10 Essential (primary) hypertension: Secondary | ICD-10-CM

## 2024-02-07 DIAGNOSIS — Z23 Encounter for immunization: Secondary | ICD-10-CM

## 2024-02-07 LAB — LIPID PANEL
Cholesterol: 275 mg/dL — ABNORMAL HIGH (ref 0–200)
HDL: 68 mg/dL (ref >39.00)
LDL Cholesterol: 168 mg/dL — ABNORMAL HIGH (ref 0–99)
NonHDL: 207.15
Total CHOL/HDL Ratio: 4
Triglycerides: 196 mg/dL — ABNORMAL HIGH (ref 0.0–149.0)
VLDL: 39.2 mg/dL (ref 0.0–40.0)

## 2024-02-07 LAB — COMPREHENSIVE METABOLIC PANEL WITH GFR
ALT: 17 U/L (ref 0–35)
AST: 14 U/L (ref 0–37)
Albumin: 4.4 g/dL (ref 3.5–5.2)
Alkaline Phosphatase: 65 U/L (ref 39–117)
BUN: 14 mg/dL (ref 6–23)
CO2: 30 meq/L (ref 19–32)
Calcium: 9.8 mg/dL (ref 8.4–10.5)
Chloride: 99 meq/L (ref 96–112)
Creatinine, Ser: 0.69 mg/dL (ref 0.40–1.20)
GFR: 100.03 mL/min (ref >60.00)
Glucose, Bld: 86 mg/dL (ref 70–99)
Potassium: 3.7 meq/L (ref 3.5–5.1)
Sodium: 137 meq/L (ref 135–145)
Total Bilirubin: 0.5 mg/dL (ref 0.2–1.2)
Total Protein: 7.7 g/dL (ref 6.0–8.3)

## 2024-02-07 LAB — CBC WITH DIFFERENTIAL/PLATELET
Basophils Absolute: 0 K/uL (ref 0.0–0.1)
Basophils Relative: 0.7 % (ref 0.0–3.0)
Eosinophils Absolute: 0.1 K/uL (ref 0.0–0.7)
Eosinophils Relative: 1.4 % (ref 0.0–5.0)
HCT: 43.4 % (ref 36.0–46.0)
Hemoglobin: 14.9 g/dL (ref 12.0–15.0)
Lymphocytes Relative: 43 % (ref 12.0–46.0)
Lymphs Abs: 2.8 K/uL (ref 0.7–4.0)
MCHC: 34.3 g/dL (ref 30.0–36.0)
MCV: 90.4 fl (ref 78.0–100.0)
Monocytes Absolute: 0.3 K/uL (ref 0.1–1.0)
Monocytes Relative: 5.3 % (ref 3.0–12.0)
Neutro Abs: 3.2 K/uL (ref 1.4–7.7)
Neutrophils Relative %: 49.6 % (ref 43.0–77.0)
Platelets: 269 K/uL (ref 150.0–400.0)
RBC: 4.81 Mil/uL (ref 3.87–5.11)
RDW: 12.7 % (ref 11.5–15.5)
WBC: 6.5 K/uL (ref 4.0–10.5)

## 2024-02-07 LAB — POCT URINALYSIS DIPSTICK
Bilirubin, UA: NEGATIVE
Blood, UA: NEGATIVE
Glucose, UA: NEGATIVE
Ketones, UA: NEGATIVE
Leukocytes, UA: NEGATIVE
Nitrite, UA: NEGATIVE
Protein, UA: POSITIVE — AB
Spec Grav, UA: 1.02 (ref 1.010–1.025)
Urobilinogen, UA: 0.2 U/dL
pH, UA: 6 (ref 5.0–8.0)

## 2024-02-07 LAB — HEMOGLOBIN A1C: Hgb A1c MFr Bld: 5 % (ref 4.6–6.5)

## 2024-02-07 LAB — VITAMIN D 25 HYDROXY (VIT D DEFICIENCY, FRACTURES): VITD: 50.78 ng/mL (ref 30.00–100.00)

## 2024-02-07 LAB — T4, FREE: Free T4: 0.72 ng/dL (ref 0.60–1.60)

## 2024-02-07 LAB — VITAMIN B12: Vitamin B-12: 461 pg/mL (ref 211–911)

## 2024-02-07 LAB — TSH: TSH: 1.53 u[IU]/mL (ref 0.35–5.50)

## 2024-02-07 NOTE — Progress Notes (Signed)
 Established Patient Office Visit   Subjective  Patient ID: Dorothy Taylor, female    DOB: 03/24/1971  Age: 53 y.o. MRN: [id number]  Chief Complaint  Patient presents with   Annual Exam    Pt is  a 53 yo female seen for CPE.  Pt is not fasting, ate around 10:30 am.  States feeling good.  Does mention intermittent burning sensation in chest with a tightness in jaw with walking up stairs or on an incline such as while hiking.  Pt states the sensation does not last long.   Also with an intermittent dull pain in R low back.  Unsure if has kidney stones as a few of her family members have in the past.  Pt denies dysuria, hematuria, constipation, muscle strain, n/v, fever, chills.    Patient Active Problem List   Diagnosis Date Noted   Acute sinusitis 02/01/2023   Anxiety 02/01/2023   Bronchospasm 02/01/2023   Cough 02/01/2023   Dysmenorrhea 02/01/2023   Hx of colonic polyp 11/16/2021   Fibroids, intramural 12/09/2020   Pelvic mass 11/25/2020   Depressive disorder 03/17/2019   Hyperlipidemia 03/17/2019   Obesity, unspecified 02/20/2018   Menorrhagia 02/20/2018   Hypovitaminosis D 05/11/2012   HTN (hypertension) 02/13/2012   Past Medical History:  Diagnosis Date   Allergy    Anxiety    Blood transfusion without reported diagnosis    Depression    Hx of colonic polyp 11/16/2021   Hyperlipidemia    Hypertension    Past Surgical History:  Procedure Laterality Date   ABDOMINAL HYSTERECTOMY  01/05/21   ROBOTIC ASSISTED LAPAROSCOPIC HYSTERECTOMY AND SALPINGECTOMY Bilateral 01/05/2021   Procedure: XI ROBOTIC ASSISTED LAPAROSCOPIC HYSTERECTOMY GREATER THAN TWO HUNDRED AND FIFTY GRAMS AND SALPINGECTOMY WITH MINI LAPAROTOMY;  Surgeon: Eloy Herring, MD;  Location: [facility];  Service: Gynecology;  Laterality: Bilateral;   Social History   Tobacco Use   Smoking status: Never   Smokeless tobacco: Never  Vaping Use   Vaping status: Never Used  Substance Use Topics   Alcohol  use: Yes    Comment: wine- once  - twice per week   Drug use: Never   Family History  Problem Relation Age of Onset   Colon polyps Mother    Hypertension Mother    Hyperlipidemia Mother    Pancreatic cancer Father    Hyperlipidemia Father    Hypertension Father    Heart attack Father    Depression Father    Cancer Father    Asthma Sister    Depression Sister    Colon cancer Paternal Aunt    Hypertension Maternal Grandmother    Hyperlipidemia Maternal Grandmother    Hypertension Maternal Grandfather    Hyperlipidemia Maternal Grandfather    Heart disease Maternal Grandfather    Heart attack Maternal Grandfather    Esophageal cancer Neg Hx    Rectal cancer Neg Hx    Stomach cancer Neg Hx    Allergies  Allergen Reactions   Crestor  [Rosuvastatin ] Other (See Comments)    Myalgias even with 5 mg twice a wk.    ROS Negative unless stated above    Objective:     BP 106/64 (BP Location: Left Arm, Patient Position: Sitting, Cuff Size: Large)   Pulse 61   Temp 98.3 F (36.8 C) (Oral)   Ht 5' 3.5 (1.613 m)   Wt 207 lb (93.9 kg)   LMP 07/10/2019 (Exact Date)   SpO2 99%   BMI 36.09 kg/m  BP  Readings from Last 3 Encounters:  02/07/24 106/64  02/02/23 110/72  04/26/22 126/88   Wt Readings from Last 3 Encounters:  02/07/24 207 lb (93.9 kg)  02/02/23 206 lb (93.4 kg)  04/26/22 227 lb (103 kg)      Physical Exam Constitutional:      Appearance: Normal appearance.  HENT:     Head: Normocephalic and atraumatic.     Right Ear: Tympanic membrane, ear canal and external ear normal.     Left Ear: Tympanic membrane, ear canal and external ear normal.     Nose: Nose normal.     Mouth/Throat:     Mouth: Mucous membranes are moist.     Pharynx: No oropharyngeal exudate or posterior oropharyngeal erythema.  Eyes:     General: No scleral icterus.    Extraocular Movements: Extraocular movements intact.     Conjunctiva/sclera: Conjunctivae normal.     Pupils: Pupils are  equal, round, and reactive to light.  Neck:     Thyroid : No thyromegaly.     Vascular: No carotid bruit.  Cardiovascular:     Rate and Rhythm: Normal rate and regular rhythm.     Pulses: Normal pulses.     Heart sounds: Normal heart sounds. No murmur heard.    No friction rub.  Pulmonary:     Effort: Pulmonary effort is normal.     Breath sounds: Normal breath sounds. No wheezing, rhonchi or rales.  Abdominal:     General: Bowel sounds are normal.     Palpations: Abdomen is soft.     Tenderness: There is no abdominal tenderness. There is no right CVA tenderness or left CVA tenderness.  Musculoskeletal:        General: No deformity. Normal range of motion.  Lymphadenopathy:     Cervical: No cervical adenopathy.  Skin:    General: Skin is warm and dry.     Findings: No lesion.  Neurological:     General: No focal deficit present.     Mental Status: She is alert and oriented to person, place, and time.  Psychiatric:        Mood and Affect: Mood normal.        Thought Content: Thought content normal.        02/07/2024    1:44 PM 02/02/2023    8:38 AM 05/17/2022    9:40 AM  Depression screen PHQ 2/9  Decreased Interest 1 0 0  Down, Depressed, Hopeless 1 0 0  PHQ - 2 Score 2 0 0  Altered sleeping 0 1   Tired, decreased energy 1 1   Change in appetite 0 0   Feeling bad or failure about yourself  0 0   Trouble concentrating 1 0   Moving slowly or fidgety/restless 0 0   Suicidal thoughts 0 0   PHQ-9 Score 4 2   Difficult doing work/chores Not difficult at all Not difficult at all       02/07/2024    1:44 PM 02/02/2023    8:38 AM 03/17/2021    8:14 AM 03/16/2020    8:47 AM  GAD 7 : Generalized Anxiety Score  Nervous, Anxious, on Edge 2 2 0 0  Control/stop worrying 1 1 0 0  Worry too much - different things 1 0 0 0  Trouble relaxing 1 1 0 0  Restless 0 1 0 0  Easily annoyed or irritable 0 1 0 0  Afraid - awful might happen 1 0 0 0  Total GAD 7 Score 6 6 0 0  Anxiety  Difficulty Not difficult at all Not difficult at all  Not difficult at all     Results for orders placed or performed in visit on 02/07/24  POCT urinalysis dipstick  Result Value Ref Range   Color, UA yellow    Clarity, UA Clear    Glucose, UA Negative Negative   Bilirubin, UA neg    Ketones, UA neg    Spec Grav, UA 1.020 1.010 - 1.025   Blood, UA neg    pH, UA 6.0 5.0 - 8.0   Protein, UA Positive (A) Negative   Urobilinogen, UA 0.2 0.2 or 1.0 E.U./dL   Nitrite, UA neg    Leukocytes, UA Negative Negative   Appearance     Odor        Assessment & Plan:   Well adult exam -     CBC with Differential/Platelet; Future -     Comprehensive metabolic panel with GFR; Future -     Hemoglobin A1c; Future -     Lipid panel; Future -     T4, free; Future -     TSH; Future  Essential hypertension -     Comprehensive metabolic panel with GFR; Future -     T4, free; Future -     TSH; Future  Mixed hyperlipidemia  Depression, recurrent  Need for influenza vaccination -     Flu vaccine trivalent PF, 6mos and older(Flulaval,Afluria,Fluarix,Fluzone)  Left-sided low back pain without sciatica, unspecified chronicity -     Comprehensive metabolic panel with GFR; Future -     POCT urinalysis dipstick  Burning in the chest -     CBC with Differential/Platelet; Future -     Vitamin B12; Future -     VITAMIN D  25 Hydroxy (Vit-D Deficiency, Fractures); Future -     D-dimer, quantitative; Future  Age appropriate health screenings discussed.  Obtain labs.  Immunizations reviewed. Flu given this visit. Colonoscopy done 11/09/2021.  Mammogram done 12/16/2022.  Gyn appointment scheduled December 2025.  Offered referral for burning sensation in lungs.  Pt declines at this time.  Obtain UA for intermittent back pain.  PHQ 9 score 4 and GAD 7 score 6 this visit.  Return in about 6 weeks (around 03/20/2024) for blood pressure.   Clotilda JONELLE Single, MD

## 2024-02-07 NOTE — Patient Instructions (Addendum)
 Stop taking the hydrochlorothiazide  12.5 mg tabs.   Continue losartan  25 mg daily.  Monitor bp and keep a log of readings.

## 2024-02-08 LAB — D-DIMER, QUANTITATIVE: D-Dimer, Quant: 0.23 ug{FEU}/mL (ref <0.50)

## 2024-02-14 ENCOUNTER — Ambulatory Visit: Payer: Self-pay | Admitting: Family Medicine

## 2024-02-26 ENCOUNTER — Other Ambulatory Visit: Payer: Self-pay | Admitting: Family Medicine

## 2024-02-26 DIAGNOSIS — F339 Major depressive disorder, recurrent, unspecified: Secondary | ICD-10-CM

## 2024-02-26 DIAGNOSIS — I10 Essential (primary) hypertension: Secondary | ICD-10-CM

## 2024-03-25 LAB — HM MAMMOGRAPHY

## 2024-04-25 ENCOUNTER — Ambulatory Visit (INDEPENDENT_AMBULATORY_CARE_PROVIDER_SITE_OTHER)

## 2024-04-25 DIAGNOSIS — Z23 Encounter for immunization: Secondary | ICD-10-CM | POA: Diagnosis not present

## 2024-04-25 NOTE — Progress Notes (Signed)
 Per orders of Dr. Salomon Fick , injection of Shingrix  given by Stann Ore. Patient tolerated injection well.

## 2024-05-09 ENCOUNTER — Encounter: Payer: Self-pay | Admitting: Family Medicine

## 2024-05-09 ENCOUNTER — Ambulatory Visit (INDEPENDENT_AMBULATORY_CARE_PROVIDER_SITE_OTHER): Admitting: Family Medicine

## 2024-05-09 VITALS — BP 110/62 | HR 60 | Temp 98.6°F | Ht 63.5 in | Wt 205.8 lb

## 2024-05-09 DIAGNOSIS — R0982 Postnasal drip: Secondary | ICD-10-CM | POA: Diagnosis not present

## 2024-05-09 DIAGNOSIS — J3089 Other allergic rhinitis: Secondary | ICD-10-CM | POA: Diagnosis not present

## 2024-05-09 MED ORDER — AMOXICILLIN-POT CLAVULANATE 500-125 MG PO TABS: 1.0000 | ORAL_TABLET | Freq: Two times a day (BID) | ORAL | 0 refills | Status: AC

## 2024-05-09 NOTE — Progress Notes (Addendum)
 "  Established Patient Office Visit   Subjective  Patient ID: Dorothy Taylor [facility], female    DOB: [birth year]  Age: 54 y.o. MRN: [id number]  Chief Complaint  Patient presents with   Acute Visit    Started in Nov, patient has a cough headaches post nasal drainage (mostly at night) and she seem to lose her voice in the evenings     Pt is a 54 yo female seen for ongoing concern.  Pt with intermittent lethargy midday, postnasal drainage, and rare cough x 2 months.  Symptoms started in Nov.  Increased drainage worse at night.  Also noticing loses voice by the end of the day.  Having rare headaches.  Feels like neck is more full/swells at the end of the day.  Tried saline nasal rinse and Advil  for symptoms.  Has humidifiers in the home.  Has a history of seasonal allergies more in spring and fall.  Doesn't feel bad.  Denies fever, chills, sick contacts, facial pain/pressure, ear pain/pressure, heartburn.  Patient mentions PHQ-9 score being elevated 2/2 current state of the world.  Otherwise okay.    Patient Active Problem List   Diagnosis Date Noted   Acute sinusitis 02/01/2023   Anxiety 02/01/2023   Bronchospasm 02/01/2023   Cough 02/01/2023   Dysmenorrhea 02/01/2023   Hx of colonic polyp 11/16/2021   Fibroids, intramural 12/09/2020   Pelvic mass 11/25/2020   Depressive disorder 03/17/2019   Hyperlipidemia 03/17/2019   Obesity, unspecified 02/20/2018   Menorrhagia 02/20/2018   Hypovitaminosis D 05/11/2012   HTN (hypertension) 02/13/2012   Past Medical History:  Diagnosis Date   Allergy    Seasonal   Anxiety    Blood transfusion without reported diagnosis    Depression    Hx of colonic polyp 11/16/2021   Hyperlipidemia    Hypertension    Past Surgical History:  Procedure Laterality Date   ABDOMINAL HYSTERECTOMY  01/05/21   ROBOTIC ASSISTED LAPAROSCOPIC HYSTERECTOMY AND SALPINGECTOMY Bilateral 01/05/2021   Procedure: XI ROBOTIC ASSISTED LAPAROSCOPIC HYSTERECTOMY GREATER  THAN TWO HUNDRED AND FIFTY GRAMS AND SALPINGECTOMY WITH MINI LAPAROTOMY;  Surgeon: Eloy Herring, MD;  Location: [facility];  Service: Gynecology;  Laterality: Bilateral;   Social History[1] Family History  Problem Relation Age of Onset   Colon polyps Mother    Hypertension Mother    Hyperlipidemia Mother    Pancreatic cancer Father    Hyperlipidemia Father    Hypertension Father    Heart attack Father    Depression Father    Cancer Father    Asthma Sister    Depression Sister    Colon cancer Paternal Aunt    Hypertension Maternal Grandmother    Hyperlipidemia Maternal Grandmother    Hypertension Maternal Grandfather    Hyperlipidemia Maternal Grandfather    Heart disease Maternal Grandfather    Heart attack Maternal Grandfather    Esophageal cancer Neg Hx    Rectal cancer Neg Hx    Stomach cancer Neg Hx    Allergies[2]  ROS Negative unless stated above    Objective:     BP 110/62 (BP Location: Left Arm, Patient Position: Sitting, Cuff Size: Large)   Pulse 60   Temp 98.6 F (37 C) (Oral)   Ht 5' 3.5 (1.613 m)   Wt 205 lb 12.8 oz (93.4 kg)   LMP 07/10/2019   SpO2 98%   BMI 35.88 kg/m  BP Readings from Last 3 Encounters:  05/09/24 110/62  02/07/24 106/64  02/02/23 110/72   Wt  Readings from Last 3 Encounters:  05/09/24 205 lb 12.8 oz (93.4 kg)  02/07/24 207 lb (93.9 kg)  02/02/23 206 lb (93.4 kg)      Physical Exam Constitutional:      General: She is not in acute distress.    Appearance: Normal appearance.  HENT:     Head: Normocephalic and atraumatic.     Right Ear: Hearing, ear canal and external ear normal.     Left Ear: Hearing, tympanic membrane, ear canal and external ear normal.     Ears:     Comments: R TM full.    Nose: Nose normal.     Mouth/Throat:     Mouth: Mucous membranes are moist.     Pharynx: Postnasal drip present.  Cardiovascular:     Rate and Rhythm: Normal rate and regular rhythm.     Heart sounds: Normal heart sounds. No murmur  heard.    No gallop.  Pulmonary:     Effort: Pulmonary effort is normal. No respiratory distress.     Breath sounds: Normal breath sounds. No wheezing, rhonchi or rales.  Lymphadenopathy:     Cervical: No cervical adenopathy.  Skin:    General: Skin is warm and dry.  Neurological:     Mental Status: She is alert and oriented to person, place, and time.        05/09/2024   10:03 AM 02/07/2024    1:44 PM 02/02/2023    8:38 AM  Depression screen PHQ 2/9  Decreased Interest 1 1 0  Down, Depressed, Hopeless 2 1 0  PHQ - 2 Score 3 2 0  Altered sleeping 1 0 1  Tired, decreased energy 3 1 1   Change in appetite 0 0 0  Feeling bad or failure about yourself  0 0 0  Trouble concentrating 1 1 0  Moving slowly or fidgety/restless 0 0 0  Suicidal thoughts 0 0 0  PHQ-9 Score 8 4  2    Difficult doing work/chores Not difficult at all Not difficult at all Not difficult at all     Data saved with a previous flowsheet row definition      05/09/2024   10:03 AM 02/07/2024    1:44 PM 02/02/2023    8:38 AM 03/17/2021    8:14 AM  GAD 7 : Generalized Anxiety Score  Nervous, Anxious, on Edge 1 2  2   0   Control/stop worrying 0 1  1  0   Worry too much - different things 0 1  0  0   Trouble relaxing 1 1  1   0   Restless 0 0  1  0   Easily annoyed or irritable 1 0  1  0   Afraid - awful might happen 1 1  0  0   Total GAD 7 Score 4 6 6  0  Anxiety Difficulty Not difficult at all Not difficult at all Not difficult at all      Data saved with a previous flowsheet row definition     No results found for any visits on 05/09/24.    Assessment & Plan:   Non-seasonal allergic rhinitis, unspecified trigger -     Amoxicillin -Pot Clavulanate; Take 1 tablet by mouth in the morning and at bedtime for 7 days.  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  Start antihistamine.  Given samples of Allegra in clinic.  Will also start ABX given  duration.  Advised  to switch antihistamine after a few days if does not seem to help symptoms.  Also consider silent reflux.  Given strict precautions.  Return for continued or worsening symptoms.  PHQ-9 score 8 and GAD-7 score for this visit.  Symptoms attributed to current state of the world.  Continue to monitor.  Return if symptoms worsen or fail to improve.   Clotilda JONELLE Single, MD     [1]  Social History Tobacco Use   Smoking status: Never   Smokeless tobacco: Never  Vaping Use   Vaping status: Never Used  Substance Use Topics   Alcohol use: Yes    Comment: wine- once  - twice per week   Drug use: Never  [2]  Allergies Allergen Reactions   Crestor  [Rosuvastatin ] Other (See Comments)    Myalgias even with 5 mg twice a wk.   "

## 2024-07-08 ENCOUNTER — Ambulatory Visit: Admitting: Family Medicine
